# Patient Record
Sex: Female | Born: 1985
Health system: Southern US, Community
[De-identification: ages and names within clinical notes are randomized; demographics above are authoritative.]

## PROBLEM LIST (undated history)

## (undated) DIAGNOSIS — E282 Polycystic ovarian syndrome: Secondary | ICD-10-CM

## (undated) DIAGNOSIS — J45909 Unspecified asthma, uncomplicated: Secondary | ICD-10-CM

## (undated) HISTORY — DX: Polycystic ovarian syndrome: E28.2

---

## 2010-06-29 ENCOUNTER — Emergency Department (HOSPITAL_COMMUNITY)
Admission: EM | Admit: 2010-06-29 | Discharge: 2010-06-29 | Payer: Self-pay | Source: Home / Self Care | Admitting: Family Medicine

## 2010-06-29 LAB — POCT PREGNANCY, URINE: Preg Test, Ur: NEGATIVE

## 2010-06-29 LAB — POCT URINALYSIS DIPSTICK
Bilirubin Urine: NEGATIVE
Ketones, ur: NEGATIVE mg/dL
Protein, ur: 30 mg/dL — AB
Urine Glucose, Fasting: NEGATIVE mg/dL

## 2012-08-25 ENCOUNTER — Emergency Department (HOSPITAL_COMMUNITY)
Admission: EM | Admit: 2012-08-25 | Discharge: 2012-08-26 | Disposition: A | Discharge status: Home / Self Care | Payer: BC Managed Care – PPO | Attending: Emergency Medicine | Admitting: Emergency Medicine

## 2012-08-25 ENCOUNTER — Encounter (HOSPITAL_COMMUNITY): Payer: Self-pay | Admitting: *Deleted

## 2012-08-25 ENCOUNTER — Emergency Department (HOSPITAL_COMMUNITY): Payer: BC Managed Care – PPO

## 2012-08-25 DIAGNOSIS — Z7982 Long term (current) use of aspirin: Secondary | ICD-10-CM | POA: Insufficient documentation

## 2012-08-25 DIAGNOSIS — Y92838 Other recreation area as the place of occurrence of the external cause: Secondary | ICD-10-CM | POA: Insufficient documentation

## 2012-08-25 DIAGNOSIS — Y9351 Activity, roller skating (inline) and skateboarding: Secondary | ICD-10-CM | POA: Insufficient documentation

## 2012-08-25 DIAGNOSIS — R229 Localized swelling, mass and lump, unspecified: Secondary | ICD-10-CM | POA: Insufficient documentation

## 2012-08-25 DIAGNOSIS — Y9239 Other specified sports and athletic area as the place of occurrence of the external cause: Secondary | ICD-10-CM | POA: Insufficient documentation

## 2012-08-25 DIAGNOSIS — S62329A Displaced fracture of shaft of unspecified metacarpal bone, initial encounter for closed fracture: Secondary | ICD-10-CM | POA: Insufficient documentation

## 2012-08-25 DIAGNOSIS — S62309A Unspecified fracture of unspecified metacarpal bone, initial encounter for closed fracture: Secondary | ICD-10-CM

## 2012-08-25 DIAGNOSIS — J45909 Unspecified asthma, uncomplicated: Secondary | ICD-10-CM | POA: Insufficient documentation

## 2012-08-25 HISTORY — DX: Unspecified asthma, uncomplicated: J45.909

## 2012-08-25 NOTE — ED Notes (Signed)
The pt fell roller skating earlier tonight and fell onto her  Rt hand

## 2012-08-26 MED ORDER — OXYCODONE-ACETAMINOPHEN 5-325 MG PO TABS: 1.0000 | ORAL_TABLET | ORAL | Status: DC | PRN

## 2012-08-26 NOTE — ED Provider Notes (Signed)
History     CSN: [id number]  Arrival date & time 08/25/12  2251   First MD Initiated Contact with Patient 08/25/12 2351      Chief Complaint  Patient presents with  . Finger Injury    (Consider location/radiation/quality/duration/timing/severity/associated sxs/prior treatment) HPI Comments: Patient is a 27 year old female who presents with right hand pain and swelling after falling at a rollerskating rink earlier tonight.  Patient states that she slipped and fell hitting her right hand on one of the nearby benches in attempt to catch herself.  Patient admits to associated swelling and pain on the dorsal surface of her hand.  Patient states movement aggravates the pain, primarily with wrist extension.  The pain is alleviated with rest and nonmovement.  Patient denies fever, head injury, LOC, numbness or tingling in her extremities, or right hand weakness. Denies injury or pain to any other area of her body.  The history is provided by the patient. No language interpreter was used.    Past Medical History  Diagnosis Date  . Asthma     History reviewed. No pertinent past surgical history.  No family history on file.  History  Substance Use Topics  . Smoking status: Never Smoker   . Smokeless tobacco: Not on file  . Alcohol Use: No    OB History   Grav Para Term Preterm Abortions TAB SAB Ect Mult Living                  Review of Systems  Constitutional: Negative for fever and chills.  Musculoskeletal: Positive for joint swelling and arthralgias.  Skin: Negative for color change, pallor and rash.  Neurological: Negative for weakness and numbness.  All other systems reviewed and are negative.    Allergies  Review of patient's allergies indicates no known allergies.  Home Medications   Current Outpatient Rx  Name  Route  Sig  Dispense  Refill  . norethindrone (MICRONOR,CAMILA,ERRIN) 0.35 MG tablet   Oral   Take 1 tablet by mouth daily.         Marland Kitchen  oxyCODONE-acetaminophen (PERCOCET/ROXICET) 5-325 MG per tablet   Oral   Take 1 tablet by mouth every 4 (four) hours as needed for pain.   6 tablet   0     BP 156/99  Pulse 88  Temp(Src) 98.7 F (37.1 C) (Oral)  Resp 18  SpO2 98%  LMP 08/02/2012  Physical Exam  Nursing note and vitals reviewed. Constitutional: She is oriented to person, place, and time. She appears well-developed and well-nourished.  HENT:  Head: Normocephalic and atraumatic.  Eyes: Conjunctivae are normal. Pupils are equal, round, and reactive to light. No scleral icterus.  Neck: Normal range of motion.  Cardiovascular: Intact distal pulses.   Capillary refill less than 2 seconds in bilateral upper extremities.  Distal radial pulses 2+ bilaterally.  Pulmonary/Chest: Effort normal. No respiratory distress.  Musculoskeletal: She exhibits tenderness. She exhibits no edema.       Right hand: She exhibits tenderness and bony tenderness. She exhibits normal two-point discrimination, normal capillary refill and no deformity. Normal sensation noted. Normal strength noted.  Patient has tenderness on palpation of the fourth right MCP joint.  She exhibits near full range of motion, with some limited wrist extension secondary to discomfort.  Soft tissue swelling mild, diffuse and appreciated on the dorsal surface of the patient's right hand.  No erythema, abrasions, ecchymosis, or lacerations appreciated.  Patient is able to make a fist and has 5  out of 5 strength against resistance of her FDP, FDS, and extensors of her R hand. Sensation intact.  Neurological: She is alert and oriented to person, place, and time.  Skin: Skin is warm and dry. She is not diaphoretic.  Psychiatric: She has a normal mood and affect. Her behavior is normal.    ED Course  Procedures (including critical care time)  Labs Reviewed - No data to display Dg Hand Complete Right  08/25/2012  *RADIOLOGY REPORT*  Clinical Data: Fall.  Metacarpal pain.   RIGHT HAND - COMPLETE 3+ VIEW  Comparison: None.  Findings: There is an oblique fracture of the right fourth metacarpal in the mid shaft, with mild dorsal and ulnar displacement.  Soft tissue swelling is evident on the lateral view. Otherwise the hand appears normal.  The other metacarpals are intact.  IMPRESSION: Oblique mildly displaced fracture of the fourth metacarpal mid shaft.   Original Report Authenticated By: Andreas Newport, M.D.      1. Metacarpal bone fracture, closed, initial encounter       MDM  Mildly displaced fracture of the 4th MCP mid shaft. Short arm splint for wrist and hand immobilization applied in ED. Patient neurovascularly intact without findings consistent with compartment syndrome, well and nontoxic appearing with stable VS. Patient to d/c with Orthopedic follow up regarding her MCP fracture. Patient given percocet to take as needed for pain and discomfort. Indications for ED return discussed. Patient states comfort and understanding with this d/c plan with no unaddressed concerns.        Antony Madura, PA-C 08/27/12 1303

## 2012-08-27 NOTE — ED Provider Notes (Signed)
Medical screening examination/treatment/procedure(s) were performed by non-physician practitioner and as supervising physician I was immediately available for consultation/collaboration.  John-Adam Lanisha Stepanian, M.D.     John-Adam Nason Conradt, MD 08/27/12 1326 

## 2012-11-17 ENCOUNTER — Encounter (HOSPITAL_COMMUNITY): Payer: Self-pay | Admitting: *Deleted

## 2012-11-17 ENCOUNTER — Emergency Department (HOSPITAL_COMMUNITY)
Admission: EM | Admit: 2012-11-17 | Discharge: 2012-11-17 | Disposition: A | Discharge status: Home / Self Care | Payer: BC Managed Care – PPO | Source: Home / Self Care | Attending: Emergency Medicine | Admitting: Emergency Medicine

## 2012-11-17 DIAGNOSIS — J020 Streptococcal pharyngitis: Secondary | ICD-10-CM

## 2012-11-17 LAB — POCT RAPID STREP A: Streptococcus, Group A Screen (Direct): POSITIVE — AB

## 2012-11-17 MED ORDER — METHYLPREDNISOLONE 4 MG PO KIT: PACK | ORAL | Status: DC

## 2012-11-17 MED ORDER — AMOXICILLIN 875 MG PO TABS: 875.0000 mg | ORAL_TABLET | Freq: Two times a day (BID) | ORAL | Status: DC

## 2012-11-17 MED ORDER — DPH-LIDO-ALHYDR-MGHYDR-SIMETH MT SUSP: 5.0000 mL | OROMUCOSAL | Status: DC | PRN

## 2012-11-17 MED ORDER — NAPROXEN 500 MG PO TABS: 500.0000 mg | ORAL_TABLET | Freq: Two times a day (BID) | ORAL | Status: DC

## 2012-11-17 NOTE — ED Provider Notes (Signed)
History     CSN: [id number]  Arrival date & time 11/17/12  1527   First MD Initiated Contact with Patient 11/17/12 1604      Chief Complaint  Patient presents with  . Sore Throat    (Consider location/radiation/quality/duration/timing/severity/associated sxs/prior treatment) HPI Comments: 27 year old female presents complaining of a sore throat for 3 days along with a mild cough. The pain is made worse by swallowing. She denies any subjective fever, chills, palpitations, rash, or shortness of breath. The cough is mild, infrequent, and nonproductive. She has been taking over-the-counter combination cold medicine with mild relief of her symptoms.  Patient is a 27 y.o. female presenting with pharyngitis.  Sore Throat Pertinent negatives include no chest pain, no abdominal pain and no shortness of breath.    Past Medical History  Diagnosis Date  . Asthma     History reviewed. No pertinent past surgical history.  No family history on file.  History  Substance Use Topics  . Smoking status: Never Smoker   . Smokeless tobacco: Not on file  . Alcohol Use: No    OB History   Grav Para Term Preterm Abortions TAB SAB Ect Mult Living                  Review of Systems  Constitutional: Negative for fever and chills.  HENT: Positive for sore throat. Negative for ear pain, congestion, rhinorrhea, sneezing, drooling, trouble swallowing, neck pain, neck stiffness, voice change, sinus pressure and ear discharge.   Eyes: Negative for visual disturbance.  Respiratory: Negative for cough and shortness of breath.   Cardiovascular: Negative for chest pain, palpitations and leg swelling.  Gastrointestinal: Negative for nausea, vomiting and abdominal pain.  Endocrine: Negative for polydipsia and polyuria.  Genitourinary: Negative for dysuria, urgency and frequency.  Musculoskeletal: Negative for myalgias and arthralgias.  Skin: Negative for rash.  Neurological: Negative for dizziness,  weakness and light-headedness.    Allergies  Review of patient's allergies indicates no known allergies.  Home Medications   Current Outpatient Rx  Name  Route  Sig  Dispense  Refill  . amoxicillin (AMOXIL) 875 MG tablet   Oral   Take 1 tablet (875 mg total) by mouth 2 (two) times daily.   14 tablet   0   . DPH-Lido-AlHydr-MgHydr-Simeth SUSP   Mouth/Throat   Use as directed 5 mLs in the mouth or throat every hour as needed (gargle and spit).   200 mL   0   . methylPREDNISolone (MEDROL DOSEPAK) 4 MG tablet      Use taper dose pack as directed   21 tablet   0   . naproxen (NAPROSYN) 500 MG tablet   Oral   Take 1 tablet (500 mg total) by mouth 2 (two) times daily.   60 tablet   0   . norethindrone (MICRONOR,CAMILA,ERRIN) 0.35 MG tablet   Oral   Take 1 tablet by mouth daily.         Marland Kitchen oxyCODONE-acetaminophen (PERCOCET/ROXICET) 5-325 MG per tablet   Oral   Take 1 tablet by mouth every 4 (four) hours as needed for pain.   6 tablet   0     BP 144/99  Pulse 78  Temp(Src) 98.6 F (37 C) (Oral)  Resp 18  SpO2 100%  LMP 11/16/2012  Physical Exam  Nursing note and vitals reviewed. Constitutional: She is oriented to person, place, and time. Vital signs are normal. She appears well-developed and well-nourished. No distress.  HENT:  Head: Atraumatic.  Mouth/Throat: Oropharyngeal exudate and posterior oropharyngeal erythema present.  Eyes: EOM are normal. Pupils are equal, round, and reactive to light.  Cardiovascular: Normal rate, regular rhythm and normal heart sounds.  Exam reveals no gallop and no friction rub.   No murmur heard. Pulmonary/Chest: Effort normal and breath sounds normal. No respiratory distress. She has no wheezes. She has no rales.  Abdominal: Soft. There is no tenderness.  Neurological: She is alert and oriented to person, place, and time. She has normal strength.  Skin: Skin is warm and dry. She is not diaphoretic.  Psychiatric: She has a  normal mood and affect. Her behavior is normal. Judgment normal.    ED Course  Procedures (including critical care time)  Labs Reviewed  POCT RAPID STREP A ([facility] ONLY) - Abnormal; Notable for the following:    Streptococcus, Group A Screen (Direct) POSITIVE (*)    All other components within normal limits   No results found.   1. Strep pharyngitis       MDM  The rapid strep is positive, we'll treat symptomatically and also with amoxicillin. She will followup if she does not improve.   Meds ordered this encounter  Medications  . DPH-Lido-AlHydr-MgHydr-Simeth SUSP    Sig: Use as directed 5 mLs in the mouth or throat every hour as needed (gargle and spit).    Dispense:  200 mL    Refill:  0  . naproxen (NAPROSYN) 500 MG tablet    Sig: Take 1 tablet (500 mg total) by mouth 2 (two) times daily.    Dispense:  60 tablet    Refill:  0  . methylPREDNISolone (MEDROL DOSEPAK) 4 MG tablet    Sig: Use taper dose pack as directed    Dispense:  21 tablet    Refill:  0  . amoxicillin (AMOXIL) 875 MG tablet    Sig: Take 1 tablet (875 mg total) by mouth 2 (two) times daily.    Dispense:  14 tablet    Refill:  0   ]        Graylon Good, PA-C 11/17/12 1726

## 2012-11-17 NOTE — ED Provider Notes (Signed)
Medical screening examination/treatment/procedure(s) were performed by non-physician practitioner and as supervising physician I was immediately available for consultation/collaboration.  Leslee Home, M.D.  Reuben Likes, MD 11/17/12 Rickey Primus

## 2012-11-17 NOTE — ED Notes (Signed)
Pt  Reports  Symptoms  Of  sorethroat   X  3  Days   She  Reports  Pain is  Worse  When she  Swallows     She  Is  Sitting  Upright on  Exam   Table  She is  Speaking in  Complete  sentances

## 2013-11-13 IMAGING — CR DG HAND COMPLETE 3+V*R*
3 series · 3 of 3 positions shown · non-contrast
Comparison: None.

CLINICAL DATA: Fall.  Metacarpal pain.

RIGHT HAND - COMPLETE 3+ VIEW

[x hand pa right]
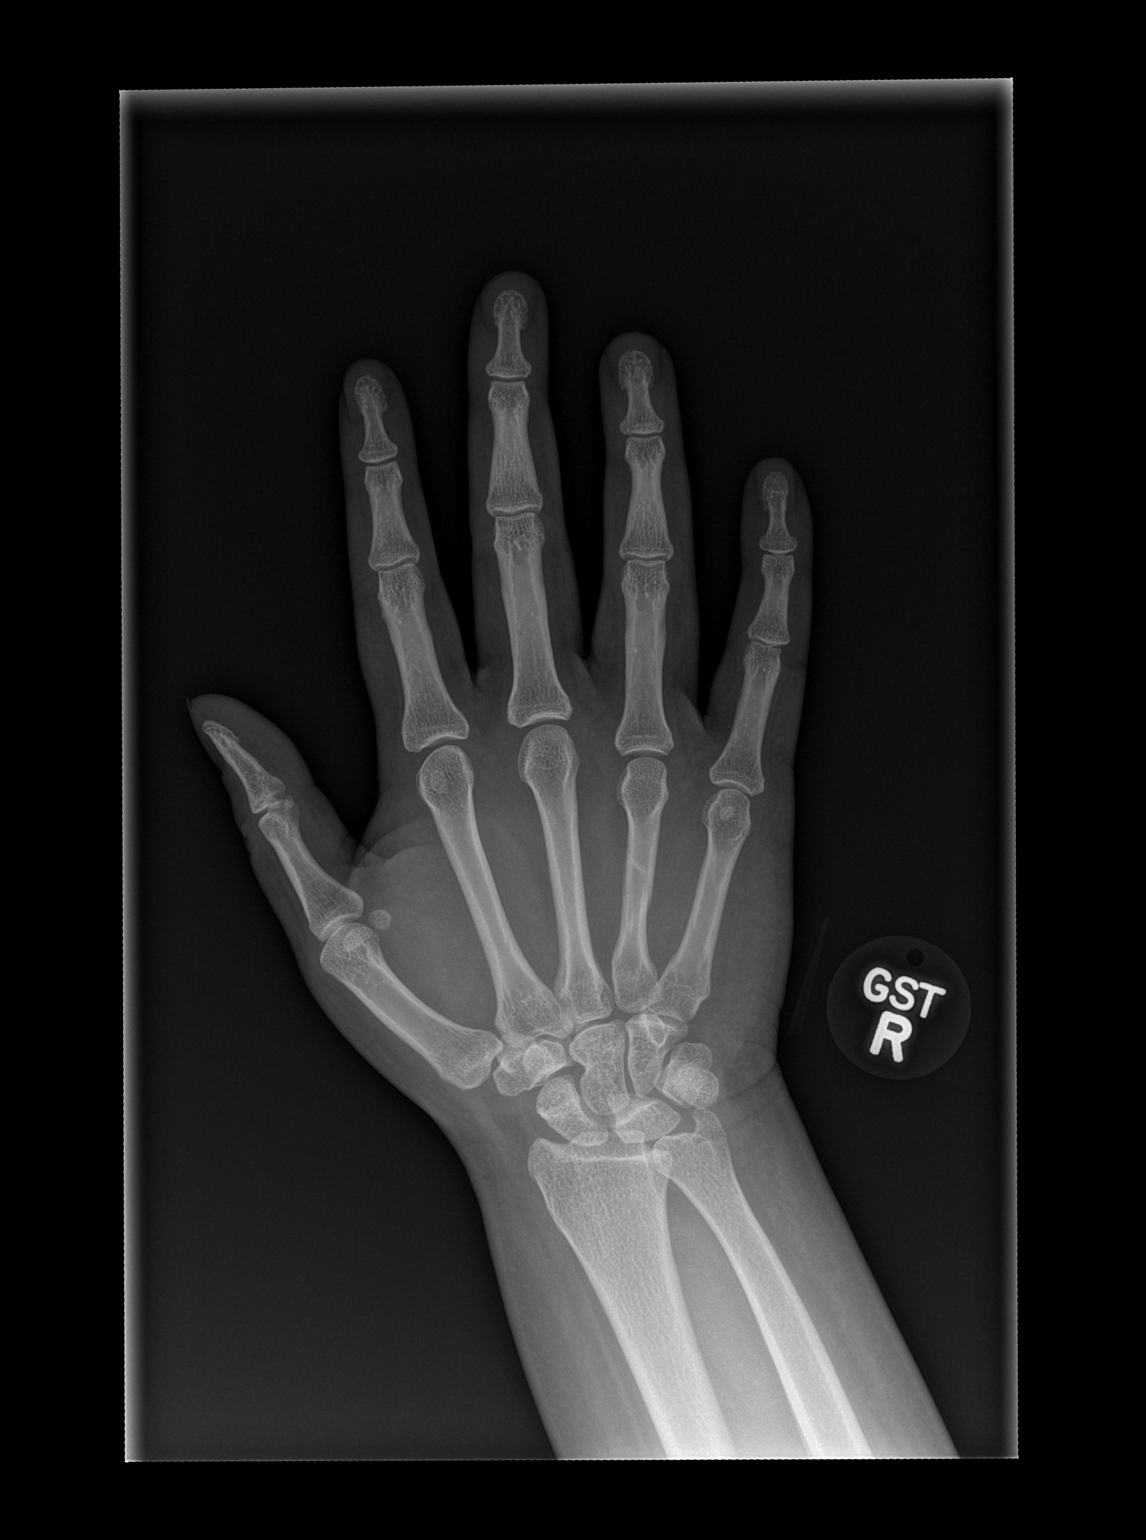

[x hand obl right]
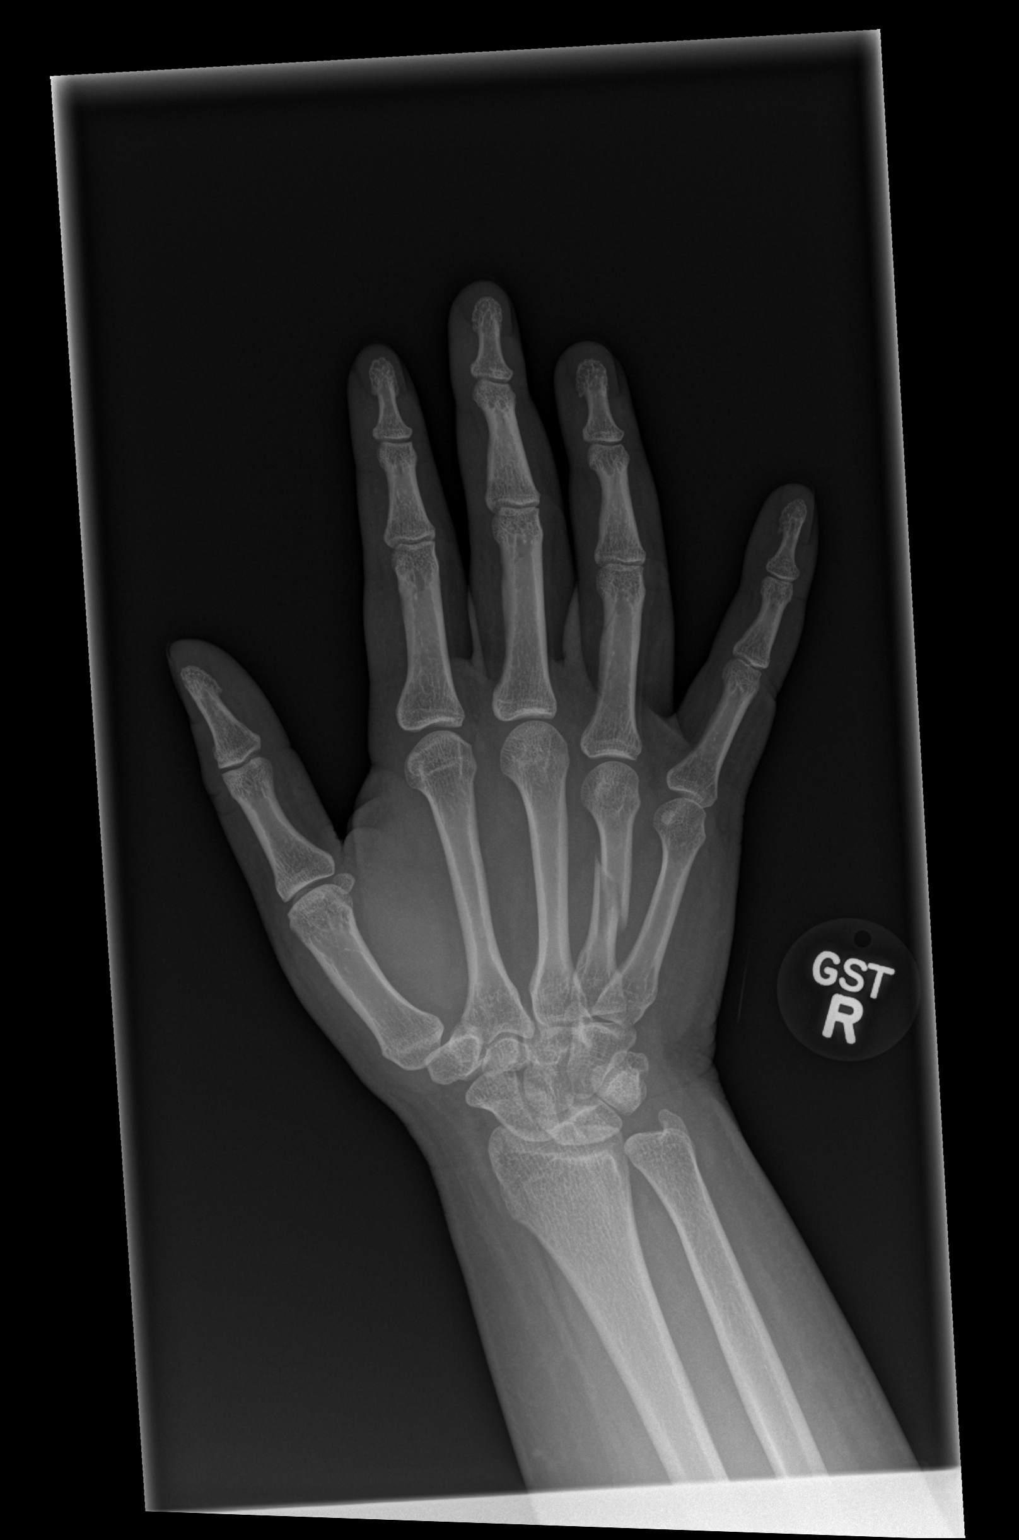

[x hand lat right]
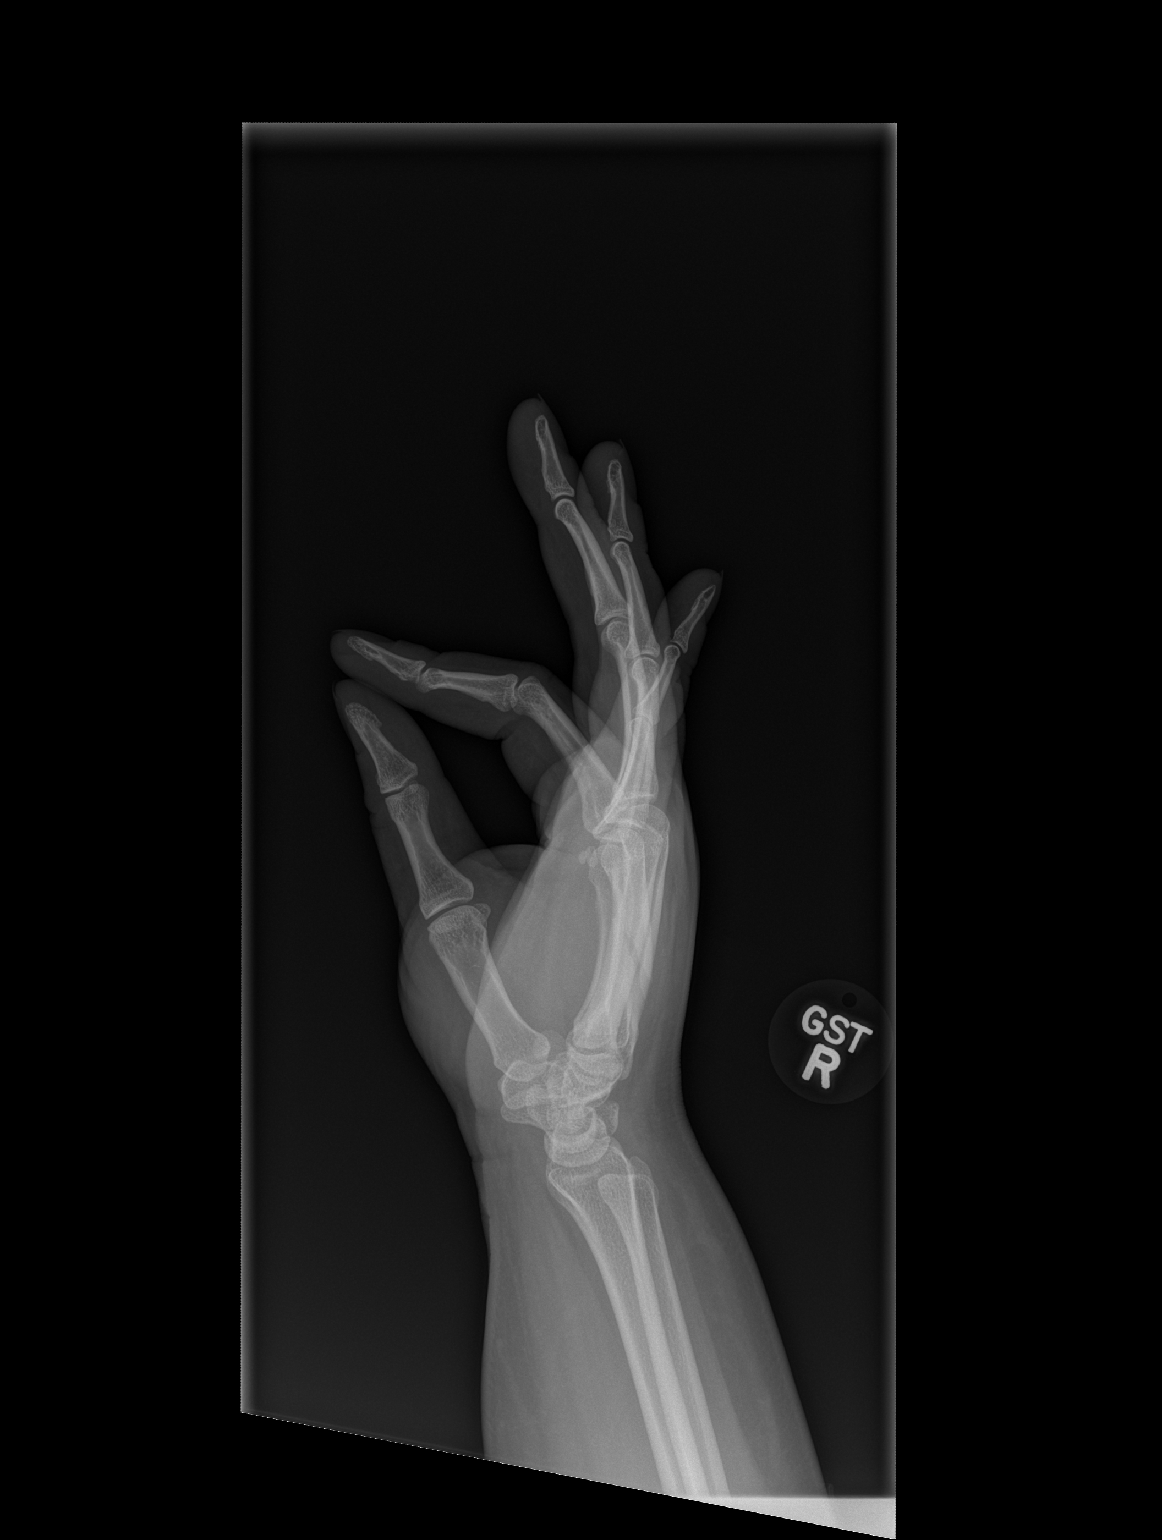

[3 of 3 positions shown; findings below may reference images not displayed]

FINDINGS: There is an oblique fracture of the right fourth
metacarpal in the mid shaft, with mild dorsal and ulnar
displacement.  Soft tissue swelling is evident on the lateral view.
Otherwise the hand appears normal.  The other metacarpals are
intact.
IMPRESSION: Oblique mildly displaced fracture of the fourth metacarpal mid
shaft.

## 2013-12-31 ENCOUNTER — Emergency Department: Payer: Self-pay | Admitting: Emergency Medicine

## 2015-01-06 ENCOUNTER — Emergency Department (HOSPITAL_COMMUNITY)
Admission: EM | Admit: 2015-01-06 | Discharge: 2015-01-06 | Disposition: A | Discharge status: Home / Self Care | Payer: BLUE CROSS/BLUE SHIELD | Attending: Emergency Medicine | Admitting: Emergency Medicine

## 2015-01-06 ENCOUNTER — Encounter (HOSPITAL_COMMUNITY): Payer: Self-pay | Admitting: *Deleted

## 2015-01-06 DIAGNOSIS — Z792 Long term (current) use of antibiotics: Secondary | ICD-10-CM | POA: Diagnosis not present

## 2015-01-06 DIAGNOSIS — R030 Elevated blood-pressure reading, without diagnosis of hypertension: Secondary | ICD-10-CM | POA: Diagnosis not present

## 2015-01-06 DIAGNOSIS — Z793 Long term (current) use of hormonal contraceptives: Secondary | ICD-10-CM | POA: Insufficient documentation

## 2015-01-06 DIAGNOSIS — H6121 Impacted cerumen, right ear: Secondary | ICD-10-CM | POA: Diagnosis present

## 2015-01-06 DIAGNOSIS — Z791 Long term (current) use of non-steroidal anti-inflammatories (NSAID): Secondary | ICD-10-CM | POA: Diagnosis not present

## 2015-01-06 DIAGNOSIS — J45909 Unspecified asthma, uncomplicated: Secondary | ICD-10-CM | POA: Diagnosis not present

## 2015-01-06 DIAGNOSIS — IMO0001 Reserved for inherently not codable concepts without codable children: Secondary | ICD-10-CM

## 2015-01-06 NOTE — Discharge Instructions (Signed)
Cerumen Impaction °A cerumen impaction is when the wax in your ear forms a plug. This plug usually causes reduced hearing. Sometimes it also causes an earache or dizziness. Removing a cerumen impaction can be difficult and painful. The wax sticks to the ear canal. The canal is sensitive and bleeds easily. If you try to remove a heavy wax buildup with a cotton tipped swab, you may push it in further. °Irrigation with water, suction, and small ear curettes may be used to clear out the wax. If the impaction is fixed to the skin in the ear canal, ear drops may be needed for a few days to loosen the wax. People who build up a lot of wax frequently can use ear wax removal products available in your local drugstore. °SEEK MEDICAL CARE IF:  °You develop an earache, increased hearing loss, or marked dizziness. °Document Released: 06/29/2004 Document Revised: 08/14/2011 Document Reviewed: 08/19/2009 °[employer]® Patient Information ©2015 [employer]. This information is not intended to replace advice given to you by your health care provider. Make sure you discuss any questions you have with your health care provider. ° °

## 2015-01-06 NOTE — ED Notes (Addendum)
Pt complains of her ear being clogged since Friday. [facility] and was given debrox to put in her ear for wax buildup. Pt states nothing has come out of her ear since then and her ear pain still feels clogged. Pt denies pain in her ear at this time.

## 2015-01-06 NOTE — ED Provider Notes (Signed)
CSN: [id number]     Arrival date & time 01/06/15  1349 History  This chart was scribed for non-physician practitioner Santiago Glad, PA-C working with Rolland Porter, MD by Littie Deeds, ED Scribe. This patient was seen in room WTR9/WTR9 and the patient's care was started at 3:23 PM.       Chief Complaint  Patient presents with  . Cerumen Impaction   The history is provided by the patient. No language interpreter was used.   HPI Comments: Brittany Mcintyre is a 29 y.o. female who presents to the Emergency Department complaining of cerumen impaction, worse in the right ear. She does report having some muffled hearing on the right side. [facility] and was given debrox for earwax buildup, but she has not felt any improvement or any wax coming out. Patient denies ear pain, ear discharge, and fever.  Past Medical History  Diagnosis Date  . Asthma    History reviewed. No pertinent past surgical history. No family history on file. History  Substance Use Topics  . Smoking status: Never Smoker   . Smokeless tobacco: Not on file  . Alcohol Use: No   OB History    No data available     Review of Systems  Constitutional: Negative for fever.  HENT: Positive for hearing loss. Negative for ear discharge and ear pain.       Allergies  Review of patient's allergies indicates no known allergies.  Home Medications   Prior to Admission medications   Medication Sig Start Date End Date Taking? Authorizing Provider  amoxicillin (AMOXIL) 875 MG tablet Take 1 tablet (875 mg total) by mouth 2 (two) times daily. 11/17/12   Graylon Good, PA-C  DPH-Lido-AlHydr-MgHydr-Simeth SUSP Use as directed 5 mLs in the mouth or throat every hour as needed (gargle and spit). 11/17/12   Graylon Good, PA-C  methylPREDNISolone (MEDROL DOSEPAK) 4 MG tablet Use taper dose pack as directed 11/17/12   Graylon Good, PA-C  naproxen (NAPROSYN) 500 MG tablet Take 1 tablet (500 mg total) by  mouth 2 (two) times daily. 11/17/12   Adrian Blackwater Baker, PA-C  norethindrone (MICRONOR,CAMILA,ERRIN) 0.35 MG tablet Take 1 tablet by mouth daily.    Historical Provider, MD  oxyCODONE-acetaminophen (PERCOCET/ROXICET) 5-325 MG per tablet Take 1 tablet by mouth every 4 (four) hours as needed for pain. 08/26/12   Antony Madura, PA-C   BP 155/115 mmHg  Pulse 84  Temp(Src) 97.7 F (36.5 C) (Oral)  Resp 18  SpO2 94%  LMP 12/30/2014 Physical Exam  Constitutional: She is oriented to person, place, and time. She appears well-developed and well-nourished. No distress.  HENT:  Head: Normocephalic and atraumatic.  Left Ear: Tympanic membrane and ear canal normal.  Mouth/Throat: Oropharynx is clear and moist. No oropharyngeal exudate.  Cerumen impaction on the right.  Eyes: Pupils are equal, round, and reactive to light.  Neck: Neck supple.  Cardiovascular: Normal rate, regular rhythm and normal heart sounds.   No murmur heard. Pulmonary/Chest: Effort normal and breath sounds normal. No respiratory distress. She has no wheezes. She has no rales.  Musculoskeletal: She exhibits no edema.  Neurological: She is alert and oriented to person, place, and time. No cranial nerve deficit.  Skin: Skin is warm and dry. No rash noted.  Psychiatric: She has a normal mood and affect. Her behavior is normal.  Nursing note and vitals reviewed.   ED Course  Procedures  DIAGNOSTIC STUDIES: Oxygen  Saturation is 94% on room air, adequate by my interpretation.    COORDINATION OF CARE: 3:27 PM-Discussed treatment plan which includes ear wax removal with patient/guardian at bedside and patient/guardian agreed to plan.    Labs Review Labs Reviewed - No data to display  Imaging Review No results found.   EKG Interpretation None      MDM   Final diagnoses:  None  Patient presents today with cerumen impaction of the right ear.  No signs of AOE.  Unable to fully visualize the TM.  However, she denies ear pain.   Ear irrigated in the ED.  Feel that the patient is stable for discharge.  Return precautions given.  I personally performed the services described in this documentation, which was scribed in my presence. The recorded information has been reviewed and is accurate.    Santiago Glad, PA-C 01/06/15 1748  Doug Sou, MD 01/08/15 0700

## 2019-04-09 DIAGNOSIS — F4323 Adjustment disorder with mixed anxiety and depressed mood: Secondary | ICD-10-CM | POA: Diagnosis not present

## 2019-04-28 DIAGNOSIS — Z20828 Contact with and (suspected) exposure to other viral communicable diseases: Secondary | ICD-10-CM | POA: Diagnosis not present

## 2019-05-14 DIAGNOSIS — F4323 Adjustment disorder with mixed anxiety and depressed mood: Secondary | ICD-10-CM | POA: Diagnosis not present

## 2020-08-18 ENCOUNTER — Other Ambulatory Visit: Payer: Self-pay | Admitting: Obstetrics and Gynecology

## 2020-08-18 DIAGNOSIS — E049 Nontoxic goiter, unspecified: Secondary | ICD-10-CM

## 2020-09-02 ENCOUNTER — Ambulatory Visit
Admission: RE | Admit: 2020-09-02 | Discharge: 2020-09-02 | Disposition: A | Discharge status: Home / Self Care | Payer: BC Managed Care – PPO | Source: Ambulatory Visit | Attending: Obstetrics and Gynecology | Admitting: Obstetrics and Gynecology

## 2020-09-02 DIAGNOSIS — E049 Nontoxic goiter, unspecified: Secondary | ICD-10-CM

## 2020-10-22 ENCOUNTER — Other Ambulatory Visit: Payer: Self-pay | Admitting: Endocrinology

## 2020-10-22 ENCOUNTER — Other Ambulatory Visit: Payer: Self-pay | Admitting: Obstetrics and Gynecology

## 2020-10-22 DIAGNOSIS — E049 Nontoxic goiter, unspecified: Secondary | ICD-10-CM

## 2020-11-02 ENCOUNTER — Ambulatory Visit: Payer: BC Managed Care – PPO | Admitting: Cardiovascular Disease

## 2020-11-04 ENCOUNTER — Ambulatory Visit
Admission: RE | Admit: 2020-11-04 | Discharge: 2020-11-04 | Disposition: A | Discharge status: Home / Self Care | Payer: BC Managed Care – PPO | Source: Ambulatory Visit | Attending: Endocrinology | Admitting: Endocrinology

## 2020-11-04 ENCOUNTER — Other Ambulatory Visit (HOSPITAL_COMMUNITY)
Admission: RE | Admit: 2020-11-04 | Discharge: 2020-11-04 | Disposition: A | Discharge status: Home / Self Care | Payer: BC Managed Care – PPO | Source: Ambulatory Visit | Attending: Radiology | Admitting: Radiology

## 2020-11-04 DIAGNOSIS — E049 Nontoxic goiter, unspecified: Secondary | ICD-10-CM | POA: Diagnosis not present

## 2020-11-05 LAB — CYTOLOGY - NON PAP

## 2021-01-20 DIAGNOSIS — Z23 Encounter for immunization: Secondary | ICD-10-CM | POA: Diagnosis not present

## 2021-02-08 ENCOUNTER — Ambulatory Visit (INDEPENDENT_AMBULATORY_CARE_PROVIDER_SITE_OTHER): Payer: BC Managed Care – PPO | Admitting: Cardiovascular Disease

## 2021-02-08 ENCOUNTER — Encounter (HOSPITAL_BASED_OUTPATIENT_CLINIC_OR_DEPARTMENT_OTHER): Payer: Self-pay | Admitting: Cardiovascular Disease

## 2021-02-08 ENCOUNTER — Encounter: Payer: Self-pay | Admitting: *Deleted

## 2021-02-08 ENCOUNTER — Other Ambulatory Visit: Payer: Self-pay

## 2021-02-08 DIAGNOSIS — I10 Essential (primary) hypertension: Secondary | ICD-10-CM

## 2021-02-08 DIAGNOSIS — Z006 Encounter for examination for normal comparison and control in clinical research program: Secondary | ICD-10-CM

## 2021-02-08 HISTORY — DX: Morbid (severe) obesity due to excess calories: E66.01

## 2021-02-08 HISTORY — DX: Essential (primary) hypertension: I10

## 2021-02-08 MED ORDER — VALSARTAN-HYDROCHLOROTHIAZIDE 320-25 MG PO TABS: 1.0000 | ORAL_TABLET | Freq: Every day | ORAL | 1 refills | Status: DC

## 2021-02-08 NOTE — Assessment & Plan Note (Addendum)
Her blood pressure is poorly controlled.  Her diastolic is not at goal.  She has been taking losartan/HCTZ.  She is already working on compliance.  She has a system that works for her to be able to remember her medicines.  We will enroll her in the PREP program to the [facility] to increase her exercise to at least 150 minutes weekly.  She will also work on her diet and received an advance hypertension clinic booklet.  She is interested in our remote patient monitoring study and consents to monitoring in the [employer] program.  She never had a work-up for early onset of hypertension.  We will check renal artery Dopplers.  Given that her blood pressure is nearly controlled on 2 agents, no plan to check renin and aldosterone levels.  She had a TSH.  We will get a copy from her endocrinologist.  She does have symptoms concerning for sleep apnea including snoring and daytime somnolence.  We will get a sleep study.  She noted an interest in getting pregnant.  If she finds a partner she will let us know and we will stop her ARB.  For now, we will switch losartan to valsartan 320mg  and continue HCTZ.

## 2021-02-08 NOTE — Patient Instructions (Addendum)
Medication Instructions:  WHEN YOU FINISH YOUR CURRENT LOSARTAN HCT RX START VALSARTAN HCT 320-25 MG DAILY    Labwork: WILL REQUEST FROM DR Talmage Nap    Testing/Procedures: Your physician has requested that you have a renal artery duplex. During this test, an ultrasound is used to evaluate blood flow to the kidneys. Allow one hour for this exam. Do not eat after midnight the day before and avoid carbonated beverages. Take your medications as you usually do.   Your physician has recommended that you have a sleep study. This test records several body functions during sleep, including: brain activity, eye movement, oxygen and carbon dioxide blood levels, heart rate and rhythm, breathing rate and rhythm, the flow of air through your mouth and nose, snoring, body muscle movements, and chest and belly movement. ONCE INSURANCE HAS APPROVED THE OFFICE WILL CALL YOU TO SCHEDULE. IF YOU DO NOT HEAR IN 2 WEEKS CALL THE OFFICE TO FOLLOW UP    Follow-Up: 03/14/2021 10:30 AM WITH PHARM D AT [facility]    You will receive a phone call from the PREP exercise and nutrition program to schedule an initial assessment.   Special Instructions:   MONITOR YOUR BLOOD PRESSURE TWICE A DAY, LOG IN THE BOOK PROVIDED. BRING THE BOOK AND YOUR BLOOD PRESSURE MACHINE TO YOUR FOLLOW UP IN 1 MONTH   DASH Eating Plan DASH stands for "Dietary Approaches to Stop Hypertension." The DASH eating plan is a healthy eating plan that has been shown to reduce high blood pressure (hypertension). It may also reduce your risk for type 2 diabetes, heart disease, and stroke. The DASH eating plan may also help with weight loss. What are tips for following this plan?  General guidelines Avoid eating more than 2,300 mg (milligrams) of salt (sodium) a day. If you have hypertension, you may need to reduce your sodium intake to 1,500 mg a day. Limit alcohol intake to no more than 1 drink a day for nonpregnant women and 2 drinks a day  for men. One drink equals 12 oz of beer, 5 oz of wine, or 1 oz of hard liquor. Work with your health care provider to maintain a healthy body weight or to lose weight. Ask what an ideal weight is for you. Get at least 30 minutes of exercise that causes your heart to beat faster (aerobic exercise) most days of the week. Activities may include walking, swimming, or biking. Work with your health care provider or diet and nutrition specialist (dietitian) to adjust your eating plan to your individual calorie needs. Reading food labels  Check food labels for the amount of sodium per serving. Choose foods with less than 5 percent of the Daily Value of sodium. Generally, foods with less than 300 mg of sodium per serving fit into this eating plan. To find whole grains, look for the word "whole" as the first word in the ingredient list. Shopping Buy products labeled as "low-sodium" or "no salt added." Buy fresh foods. Avoid canned foods and premade or frozen meals. Cooking Avoid adding salt when cooking. Use salt-free seasonings or herbs instead of table salt or sea salt. Check with your health care provider or pharmacist before using salt substitutes. Do not fry foods. Cook foods using healthy methods such as baking, boiling, grilling, and broiling instead. Cook with heart-healthy oils, such as olive, canola, soybean, or sunflower oil. Meal planning Eat a balanced diet that includes: 5 or more servings of fruits and vegetables each day. At each meal, try  to fill half of your plate with fruits and vegetables. Up to 6-8 servings of whole grains each day. Less than 6 oz of lean meat, poultry, or fish each day. A 3-oz serving of meat is about the same size as a deck of cards. One egg equals 1 oz. 2 servings of low-fat dairy each day. A serving of nuts, seeds, or beans 5 times each week. Heart-healthy fats. Healthy fats called Omega-3 fatty acids are found in foods such as flaxseeds and coldwater fish, like  sardines, salmon, and mackerel. Limit how much you eat of the following: Canned or prepackaged foods. Food that is high in trans fat, such as fried foods. Food that is high in saturated fat, such as fatty meat. Sweets, desserts, sugary drinks, and other foods with added sugar. Full-fat dairy products. Do not salt foods before eating. Try to eat at least 2 vegetarian meals each week. Eat more home-cooked food and less restaurant, buffet, and fast food. When eating at a restaurant, ask that your food be prepared with less salt or no salt, if possible. What foods are recommended? The items listed may not be a complete list. Talk with your dietitian about what dietary choices are best for you. Grains Whole-grain or whole-wheat bread. Whole-grain or whole-wheat pasta. Brown rice. Orpah Cobb. Bulgur. Whole-grain and low-sodium cereals. Pita bread. Low-fat, low-sodium crackers. Whole-wheat flour tortillas. Vegetables Fresh or frozen vegetables (raw, steamed, roasted, or grilled). Low-sodium or reduced-sodium tomato and vegetable juice. Low-sodium or reduced-sodium tomato sauce and tomato paste. Low-sodium or reduced-sodium canned vegetables. Fruits All fresh, dried, or frozen fruit. Canned fruit in natural juice (without added sugar). Meat and other protein foods Skinless chicken or [place]. Ground chicken or [place]. Pork with fat trimmed off. Fish and seafood. Egg whites. Dried beans, peas, or lentils. Unsalted nuts, nut butters, and seeds. Unsalted canned beans. Lean cuts of beef with fat trimmed off. Low-sodium, lean deli meat. Dairy Low-fat (1%) or fat-free (skim) milk. Fat-free, low-fat, or reduced-fat cheeses. Nonfat, low-sodium ricotta or cottage cheese. Low-fat or nonfat yogurt. Low-fat, low-sodium cheese. Fats and oils Soft margarine without trans fats. Vegetable oil. Low-fat, reduced-fat, or light mayonnaise and salad dressings (reduced-sodium). Canola, safflower, olive, soybean, and  sunflower oils. Avocado. Seasoning and other foods Herbs. Spices. Seasoning mixes without salt. Unsalted popcorn and pretzels. Fat-free sweets. What foods are not recommended? The items listed may not be a complete list. Talk with your dietitian about what dietary choices are best for you. Grains Baked goods made with fat, such as croissants, muffins, or some breads. Dry pasta or rice meal packs. Vegetables Creamed or fried vegetables. Vegetables in a cheese sauce. Regular canned vegetables (not low-sodium or reduced-sodium). Regular canned tomato sauce and paste (not low-sodium or reduced-sodium). Regular tomato and vegetable juice (not low-sodium or reduced-sodium). Rosita Fire. Olives. Fruits Canned fruit in a light or heavy syrup. Fried fruit. Fruit in cream or butter sauce. Meat and other protein foods Fatty cuts of meat. Ribs. Fried meat. Tomasa Blase. Sausage. Bologna and other processed lunch meats. Salami. Fatback. Hotdogs. Bratwurst. Salted nuts and seeds. Canned beans with added salt. Canned or smoked fish. Whole eggs or egg yolks. Chicken or [place] with skin. Dairy Whole or 2% milk, cream, and half-and-half. Whole or full-fat cream cheese. Whole-fat or sweetened yogurt. Full-fat cheese. Nondairy creamers. Whipped toppings. Processed cheese and cheese spreads. Fats and oils Butter. Stick margarine. Lard. Shortening. Ghee. Bacon fat. Tropical oils, such as coconut, palm kernel, or palm oil. Seasoning and other foods  Salted popcorn and pretzels. Onion salt, garlic salt, seasoned salt, table salt, and sea salt. Worcestershire sauce. Tartar sauce. Barbecue sauce. Teriyaki sauce. Soy sauce, including reduced-sodium. Steak sauce. Canned and packaged gravies. Fish sauce. Oyster sauce. Cocktail sauce. Horseradish that you find on the shelf. Ketchup. Mustard. Meat flavorings and tenderizers. Bouillon cubes. Hot sauce and Tabasco sauce. Premade or packaged marinades. Premade or packaged taco seasonings.  Relishes. Regular salad dressings. Where to find more information: National Heart, Lung, and Blood Institute: PopSteam.is American Heart Association: www.heart.org Summary The DASH eating plan is a healthy eating plan that has been shown to reduce high blood pressure (hypertension). It may also reduce your risk for type 2 diabetes, heart disease, and stroke. With the DASH eating plan, you should limit salt (sodium) intake to 2,300 mg a day. If you have hypertension, you may need to reduce your sodium intake to 1,500 mg a day. When on the DASH eating plan, aim to eat more fresh fruits and vegetables, whole grains, lean proteins, low-fat dairy, and heart-healthy fats. Work with your health care provider or diet and nutrition specialist (dietitian) to adjust your eating plan to your individual calorie needs. This information is not intended to replace advice given to you by your health care provider. Make sure you discuss any questions you have with your health care provider. Document Released: 05/11/2011 Document Revised: 05/04/2017 Document Reviewed: 05/15/2016 Elsevier Patient Education  2020 [employer].

## 2021-02-08 NOTE — Research (Signed)
Ms. Brittany Mcintyre met criteria for the Virtual care and social determinant interventions for the management of hypertension trial. The trial was discussed with the her including risks/benefits. She was given the opportunity to read the consent and ask questions. The consent was signed and she was given a copy. No protocol related procedures were performed prior to the informed consent. Ms. Brittany Mcintyre was randomized to Group 1-advanced hypertension clinic

## 2021-02-08 NOTE — Progress Notes (Signed)
Advanced Hypertension Clinic Initial Assessment:    Date:  02/08/2021   ID:  Brittany Mcintyre, DOB 1985-12-18, MRN [id number]  PCP:  [facility]  Cardiologist:  None  Nephrologist:  Referring MD: Maxie Better, MD   CC: Hypertension  History of Present Illness:    Brittany Mcintyre is a 35 y.o. female with a hx of hypertension here to establish care in the Advanced Hypertension Clinic.  She notes that she was first diagnosed with hypertension in her teens.  She does not recall having any work-up for why she developed hypertension at such a young age.  She did start taking medications in 2019.  She notes when she takes her medicine is typically fairly well have been controlled.  She was struggling to remember to take it every day but now takes it in the morning every day when she brushes her teeth.  She notes that when her blood pressure is high such as when she has salty foods she gets a headache.  Lately this has not happened since she been taking her medicine more regularly.  She does not get much exercise.  She denies any exertional chest pain or shortness of breath.  She has no lower extremity edema, orthopnea, or PND.  She has been struggling to cook more at home and limit her sodium intake.  She notes that she does not have a lot of fruits and vegetables in her diet.  She does snore and has daytime somnolence.  She does not know if she is had apnea.  She saw Dr. Cherly Hensen and was noted to have a thyroid nodule.  She was sent for an ultrasound and ultimately had a biopsy that she says was unremarkable.  She notes that she is interested in potentially having a baby sometime in the next several years but does not yet have a partner.   Previous antihypertensives: Losartan/HCTZ   Past Medical History:  Diagnosis Date   Asthma    Essential hypertension 02/08/2021   Morbid obesity (HCC) 02/08/2021    History reviewed. No pertinent surgical history.  Current  Medications: Current Meds  Medication Sig   cetirizine (ZYRTEC) 10 MG tablet Take 10 mg by mouth daily as needed for allergies.   metFORMIN (GLUCOPHAGE-XR) 500 MG 24 hr tablet SMARTSIG:1 Tablet(s) By Mouth Every Evening   Multiple Vitamins-Minerals (MULTIVITAMIN ADULT PO) Take 1 tablet by mouth daily.   valsartan-hydrochlorothiazide (DIOVAN-HCT) 320-25 MG tablet Take 1 tablet by mouth daily.   [DISCONTINUED] losartan-hydrochlorothiazide (HYZAAR) 100-25 MG tablet Take 1 tablet by mouth daily.     Allergies:   Patient has no known allergies.   Social History   Socioeconomic History   Marital status: Single    Spouse name: Not on file   Number of children: Not on file   Years of education: Not on file   Highest education level: Not on file  Occupational History   Not on file  Tobacco Use   Smoking status: Never   Smokeless tobacco: Never  Substance and Sexual Activity   Alcohol use: No   Drug use: Not on file   Sexual activity: Not on file  Other Topics Concern   Not on file  Social History Narrative   Not on file   Social Determinants of Health   Financial Resource Strain: Low Risk    Difficulty of Paying Living Expenses: Not hard at all  Food Insecurity: No Food Insecurity   Worried About Running Out of Food in  the Last Year: Never true   Ran Out of Food in the Last Year: Never true  Transportation Needs: No Transportation Needs   Lack of Transportation (Medical): No   Lack of Transportation (Non-Medical): No  Physical Activity: Inactive   Days of Exercise per Week: 0 days   Minutes of Exercise per Session: 0 min  Stress: Not on file  Social Connections: Not on file     Family History: The patient's family history includes Diabetes in her mother; Heart attack in her maternal grandmother and mother; Hypertension in her father, maternal grandfather, maternal grandmother, mother, and paternal grandmother; Stroke in her paternal grandmother.  ROS:   Please see the  history of present illness.     All other systems reviewed and are negative.  EKGs/Labs/Other Studies Reviewed:    EKG: 02/08/21: EKG is sinus rhythm.  Rate 91 bpm.  Low voltage.    Recent Labs: No results found for requested labs within last 8760 hours.   Recent Lipid Panel No results found for: CHOL, TRIG, HDL, CHOLHDL, VLDL, LDLCALC, LDLDIRECT  Physical Exam:   VS:  BP (!) 131/94   Pulse 91   Ht 5\' 7"  (1.702 m)   Wt (!) 323 lb 1.6 oz (146.6 kg)   BMI 50.60 kg/m  , BMI Body mass index is 50.6 kg/m. GENERAL:  Well appearing HEENT: Pupils equal round and reactive, fundi not visualized, oral mucosa unremarkable NECK:  No jugular venous distention, waveform within normal limits, carotid upstroke brisk and symmetric, no bruits LUNGS:  Clear to auscultation bilaterally HEART:  RRR.  PMI not displaced or sustained,S1 and S2 within normal limits, no S3, no S4, no clicks, no rubs, no murmurs ABD:  Flat, positive bowel sounds normal in frequency in pitch, no bruits, no rebound, no guarding, no midline pulsatile mass, no hepatomegaly, no splenomegaly EXT:  2 plus pulses throughout, no edema, no cyanosis no clubbing SKIN:  No rashes no nodules NEURO:  Cranial nerves II through XII grossly intact, motor grossly intact throughout PSYCH:  Cognitively intact, oriented to person place and time   ASSESSMENT/PLAN:    Essential hypertension Her blood pressure is poorly controlled.  Her diastolic is not at goal.  She has been taking losartan/HCTZ.  She is already working on compliance.  She has a system that works for her to be able to remember her medicines.  We will enroll her in the PREP program to the [facility] to increase her exercise to at least 150 minutes weekly.  She will also work on her diet and received an advance hypertension clinic booklet.  She is interested in our remote patient monitoring study and consents to monitoring in the vivify program.  She never had a work-up for early onset of  hypertension.  We will check renal artery Dopplers.  Given that her blood pressure is nearly controlled on 2 agents, no plan to check renin and aldosterone levels.  She had a TSH.  We will get a copy from her endocrinologist.  She does have symptoms concerning for sleep apnea including snoring and daytime somnolence.  We will get a sleep study.  She noted an interest in getting pregnant.  If she finds a partner she will let [facility] know and we will stop her ARB.  For now, we will switch losartan to valsartan 320mg  and continue HCTZ.  Morbid obesity (HCC) Referral to the prep exercise program to the [facility].  Work on increasing exercise to at least 150 minutes weekly.  Screening for Secondary Hypertension:  Causes 02/08/2021  Drugs/Herbals Screened     - Comments limited caffeine.  Renovascular HTN Screened     - Comments check renal Dopplers  Sleep Apnea Screened     - Comments will get sleep study  Thyroid Disease Screened  Hyperaldosteronism N/A  Pheochromocytoma N/A  Cushing's Syndrome N/A  Hyperparathyroidism N/A  Coarctation of the Aorta Screened     - Comments BP symmetric  Compliance Screened     - Comments struggles but she is improving.    Relevant Labs/Studies:                Renovascular  02/08/2021  Renal Artery [place] Completed Yes      Disposition:    FU with MD/PharmD in 1 months    Medication Adjustments/Labs and Tests Ordered: Current medicines are reviewed at length with the patient today.  Concerns regarding medicines are outlined above.  Orders Placed This Encounter  Procedures   Amb Referral To Provider Referral Exercise Program (P.R.E.P)   EKG 12-Lead   VAS US RENAL ARTERY DUPLEX    Meds ordered this encounter  Medications   valsartan-hydrochlorothiazide (DIOVAN-HCT) 320-25 MG tablet    Sig: Take 1 tablet by mouth daily.    Dispense:  90 tablet    Refill:  1      Signed, Chilton Si, MD  02/08/2021 5:02 PM    [facility]

## 2021-02-08 NOTE — Assessment & Plan Note (Signed)
Referral to the prep exercise program to the [facility].  Work on increasing exercise to at least 150 minutes weekly.

## 2021-02-11 ENCOUNTER — Telehealth: Payer: Self-pay

## 2021-02-11 NOTE — Telephone Encounter (Signed)
Called to discuss PREP program referral, left voicemail asking for return call.

## 2021-02-14 ENCOUNTER — Other Ambulatory Visit (HOSPITAL_BASED_OUTPATIENT_CLINIC_OR_DEPARTMENT_OTHER): Payer: Self-pay | Admitting: *Deleted

## 2021-02-14 DIAGNOSIS — R0683 Snoring: Secondary | ICD-10-CM

## 2021-02-14 DIAGNOSIS — R400 Somnolence: Secondary | ICD-10-CM

## 2021-02-17 ENCOUNTER — Encounter (HOSPITAL_BASED_OUTPATIENT_CLINIC_OR_DEPARTMENT_OTHER): Payer: BC Managed Care – PPO

## 2021-02-17 ENCOUNTER — Telehealth: Payer: Self-pay | Admitting: *Deleted

## 2021-02-17 NOTE — Telephone Encounter (Signed)
Left sleep study appointment details on voice mail. 

## 2021-02-17 NOTE — Telephone Encounter (Signed)
-----   Message from Burnell Blanks, LPN sent at 07/05/4386  3:05 PM EDT ----- Sorry is now lol  ----- Message ----- From: Gaynelle Cage, CMA Sent: 02/11/2021   3:14 PM EDT To: Burnell Blanks, LPN  No order is in the system. ----- Message ----- From: Burnell Blanks, LPN Sent: 01/10/5796   5:55 PM EDT To: Burnell Blanks, LPN, C[facility] Div Sleep Studies  Hello all  Patient needs sleep study  Thanks  [place]

## 2021-02-23 ENCOUNTER — Emergency Department
Admission: EM | Admit: 2021-02-23 | Discharge: 2021-02-23 | Disposition: A | Discharge status: Home / Self Care | Payer: BC Managed Care – PPO | Attending: Emergency Medicine | Admitting: Emergency Medicine

## 2021-02-23 ENCOUNTER — Other Ambulatory Visit: Payer: Self-pay

## 2021-02-23 ENCOUNTER — Encounter: Payer: Self-pay | Admitting: Emergency Medicine

## 2021-02-23 DIAGNOSIS — Y92410 Unspecified street and highway as the place of occurrence of the external cause: Secondary | ICD-10-CM | POA: Insufficient documentation

## 2021-02-23 DIAGNOSIS — Z79899 Other long term (current) drug therapy: Secondary | ICD-10-CM | POA: Insufficient documentation

## 2021-02-23 DIAGNOSIS — S3992XA Unspecified injury of lower back, initial encounter: Secondary | ICD-10-CM | POA: Diagnosis not present

## 2021-02-23 DIAGNOSIS — J45909 Unspecified asthma, uncomplicated: Secondary | ICD-10-CM | POA: Insufficient documentation

## 2021-02-23 DIAGNOSIS — I10 Essential (primary) hypertension: Secondary | ICD-10-CM | POA: Diagnosis not present

## 2021-02-23 DIAGNOSIS — S39012A Strain of muscle, fascia and tendon of lower back, initial encounter: Secondary | ICD-10-CM | POA: Diagnosis not present

## 2021-02-23 DIAGNOSIS — M79601 Pain in right arm: Secondary | ICD-10-CM | POA: Insufficient documentation

## 2021-02-23 MED ORDER — NAPROXEN 375 MG PO TABS: 375.0000 mg | ORAL_TABLET | Freq: Two times a day (BID) | ORAL | 0 refills | Status: DC

## 2021-02-23 NOTE — ED Notes (Signed)
ED Provider at bedside. 
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
## 2021-02-28 ENCOUNTER — Encounter (HOSPITAL_BASED_OUTPATIENT_CLINIC_OR_DEPARTMENT_OTHER): Payer: BC Managed Care – PPO

## 2021-03-14 ENCOUNTER — Other Ambulatory Visit: Payer: Self-pay

## 2021-03-14 ENCOUNTER — Ambulatory Visit (INDEPENDENT_AMBULATORY_CARE_PROVIDER_SITE_OTHER): Payer: BC Managed Care – PPO | Admitting: Pharmacist Clinician (PhC)/ Clinical Pharmacy Specialist

## 2021-03-14 VITALS — BP 136/95 | HR 84 | Resp 17 | Ht 67.0 in | Wt 325.8 lb

## 2021-03-14 DIAGNOSIS — I10 Essential (primary) hypertension: Secondary | ICD-10-CM

## 2021-03-14 LAB — BASIC METABOLIC PANEL
BUN/Creatinine Ratio: 11 (ref 9–23)
BUN: 9 mg/dL (ref 6–20)
CO2: 24 mmol/L (ref 20–29)
Calcium: 9.4 mg/dL (ref 8.7–10.2)
Chloride: 102 mmol/L (ref 96–106)
Creatinine, Ser: 0.82 mg/dL (ref 0.57–1.00)
Glucose: 86 mg/dL (ref 70–99)
Potassium: 3.6 mmol/L (ref 3.5–5.2)
Sodium: 138 mmol/L (ref 134–144)
eGFR: 97 mL/min/{1.73_m2} (ref >59)
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
## 2021-03-14 NOTE — Patient Instructions (Addendum)
Return for a a follow up appointment November 7 at 10:30 am  Go to the lab today to check kidney function  Check your blood pressure at home daily and keep record of the readings.  Take your BP meds as follows:  Start valsartan hctz 320/25 mg once daily in the mornings  Bring all of your meds, your BP cuff and your record of home blood pressures to your next appointment.  Exercise as you're able, try to walk approximately 30 minutes per day.  Keep salt intake to a minimum, especially watch canned and prepared boxed foods.  Eat more fresh fruits and vegetables and fewer canned items.  Avoid eating in fast food restaurants.    HOW TO TAKE YOUR BLOOD PRESSURE: Rest 5 minutes before taking your blood pressure.  Don't smoke or drink caffeinated beverages for at least 30 minutes before. Take your blood pressure before (not after) you eat. Sit comfortably with your back supported and both feet on the floor (don't cross your legs). Elevate your arm to heart level on a table or a desk. Use the proper sized cuff. It should fit smoothly and snugly around your bare upper arm. There should be enough room to slip a fingertip under the cuff. The bottom edge of the cuff should be 1 inch above the crease of the elbow. Ideally, take 3 measurements at one sitting and record the average.

## 2021-03-14 NOTE — Assessment & Plan Note (Signed)
Patient with essential hypertension, with both systolic and diastolic elevations.  She has not yet started valsartan hctz, so will make no changes, as she is to start that tomorrow.  Will draw BMET today, as we have no baseline labs for her.  Reviewed information regarding diagnosis and need to treat aggressively from early age to prevent long term heart damage.  Patient states compliance with medications, so encouraged her to continue with this.  We will see her back in 1 month for follow up.

## 2021-03-15 ENCOUNTER — Ambulatory Visit (INDEPENDENT_AMBULATORY_CARE_PROVIDER_SITE_OTHER): Payer: BC Managed Care – PPO

## 2021-03-15 DIAGNOSIS — I10 Essential (primary) hypertension: Secondary | ICD-10-CM

## 2021-03-17 ENCOUNTER — Telehealth: Payer: Self-pay

## 2021-03-17 NOTE — Telephone Encounter (Signed)
Returned my call re: PREP program; she prefers afternoon at [employer] ask Pam RN [facility] to contact her about next classes at [place]

## 2021-04-07 ENCOUNTER — Encounter (HOSPITAL_BASED_OUTPATIENT_CLINIC_OR_DEPARTMENT_OTHER): Payer: BC Managed Care – PPO | Admitting: Cardiovascular Disease

## 2021-04-08 ENCOUNTER — Telehealth: Payer: Self-pay

## 2021-04-08 NOTE — Telephone Encounter (Signed)
VMT pt requesting call back to discuss referral to PREP and next class starting 05/09/21

## 2021-04-11 ENCOUNTER — Ambulatory Visit (INDEPENDENT_AMBULATORY_CARE_PROVIDER_SITE_OTHER): Payer: BC Managed Care – PPO | Admitting: Pharmacist Clinician (PhC)/ Clinical Pharmacy Specialist

## 2021-04-11 ENCOUNTER — Other Ambulatory Visit: Payer: Self-pay

## 2021-04-11 ENCOUNTER — Encounter: Payer: Self-pay | Admitting: Pharmacist Clinician (PhC)/ Clinical Pharmacy Specialist

## 2021-04-11 DIAGNOSIS — I10 Essential (primary) hypertension: Secondary | ICD-10-CM

## 2021-04-11 MED ORDER — AMLODIPINE BESYLATE 5 MG PO TABS: 5.0000 mg | ORAL_TABLET | Freq: Every day | ORAL | 1 refills | Status: DC

## 2021-04-11 NOTE — Progress Notes (Signed)
04/11/2021 Brittany Mcintyre 08/10/1985 [id number]   HPI:  Brittany Mcintyre is a 35 y.o. female patient of Dr Duke Salvia, who presents today for advanced hypertension clinic follow up.  In addition to hypertension, her medical history is significant for pre-diabetes and obesity.  She was seen by Dr. Duke Salvia in September and agreed to be a part of our [facility] monitoring program.  We will see her monthly x 3 visits, then have her follow up with Dr. Duke Salvia.   At that visit she noted having been first diagnosed with hypertension as as teen, but did not start taking any medications until 2019.   She admitted to challenges with compliance, but has recently found a routine that is working for her.  She also notes that she would like to have children in the next several years, however does not currently have a partner.   At her last visit she had not yet switched from losartan to valsartan hctz, but was down to 1-2 tablets of losartan.  She had mostly diastolic hypertension at 136/95.  No changes were made other than to have her start the valsartan.    Today she is in the office for follow up.    Blood Pressure Goal:  130/80  Current Medications: valsartan hctz 320/25 (will start tomorrow)  Family Hx: both parents with hypertenison, mom died 67 MI (34), maternal aunt died MI (late 42's), mgm died MI (47); no siblings  Social Hx: no tobacco, rare social drink, occasional coffee home brewed, some Pepsi  Diet: has been working to improve eating habits, now eating out only 1-2 times per week, trying to stay consistent with fruits and vegetables.    Exercise: Pam left message to call back for PREP; hopeful to start December class  Home BP readings: [facility] - Welch Allyn cuff  (home cuff read within 10 points of office cuff today)  15 AM readings average 143/84 (last month average 144/93)   9 PM readings average 132/77 (last month average 143/87)  Intolerances:  nkda  Labs: 10/22:  Na 138, K 3.6,  Glu 86, BUN 9, SCr 0.82 GFR 97   Wt Readings from Last 3 Encounters:  04/11/21 (!) 321 lb (145.6 kg)  03/14/21 (!) 325 lb 12.8 oz (147.8 kg)  02/23/21 (!) 321 lb 14 oz (146 kg)   BP Readings from Last 3 Encounters:  04/11/21 118/78  03/14/21 (!) 136/95  02/23/21 130/90   Pulse Readings from Last 3 Encounters:  04/11/21 80  03/14/21 84  02/23/21 (!) 107    Current Outpatient Medications  Medication Sig Dispense Refill   amLODipine (NORVASC) 5 MG tablet Take 1 tablet (5 mg total) by mouth daily. 90 tablet 1   cetirizine (ZYRTEC) 10 MG tablet Take 10 mg by mouth daily as needed for allergies.     metFORMIN (GLUCOPHAGE-XR) 500 MG 24 hr tablet SMARTSIG:1 Tablet(s) By Mouth Every Evening     Multiple Vitamins-Minerals (MULTIVITAMIN ADULT PO) Take 1 tablet by mouth daily.     valsartan-hydrochlorothiazide (DIOVAN-HCT) 320-25 MG tablet Take 1 tablet by mouth daily. 90 tablet 1   No current facility-administered medications for this visit.    Allergies  Allergen Reactions   Shellfish Allergy Itching    Past Medical History:  Diagnosis Date   Asthma    Essential hypertension 02/08/2021   Morbid obesity (HCC) 02/08/2021    Blood pressure 118/78, pulse 80, resp. rate 16, height 5\' 7"  (1.702 m), weight (!) 321 lb (145.6 kg), SpO2  97 %.  Essential hypertension Patient with mixed systolic and diastolic hypertension, doing better since switched losartan/hctz  to valsartan/hctz.  Still not at goal, so will add amlodipine 5 mg daily.  Since her evening readings are 11/7 points lower on average, will have her take amlodipine at night to see if this will help lower the morning readings.  She will continue to check home BP and we will see her back in 1 month for follow up.     Phillips Hay PharmD CPP [facility] [address] [place], [place] [address] [phone]

## 2021-04-11 NOTE — Patient Instructions (Signed)
Return for a a follow up appointment December 5 at 10:30 am  Check your blood pressure at home daily and keep record of the readings.  Take your BP meds as follows:  Start amlodipine 5 mg once daily in the evenings  Continue valsartan/hctz 320/25 once daily in the mornings  Bring all of your meds, your BP cuff and your record of home blood pressures to your next appointment.  Exercise as you're able, try to walk approximately 30 minutes per day.  Keep salt intake to a minimum, especially watch canned and prepared boxed foods.  Eat more fresh fruits and vegetables and fewer canned items.  Avoid eating in fast food restaurants.    HOW TO TAKE YOUR BLOOD PRESSURE: Rest 5 minutes before taking your blood pressure.  Don't smoke or drink caffeinated beverages for at least 30 minutes before. Take your blood pressure before (not after) you eat. Sit comfortably with your back supported and both feet on the floor (don't cross your legs). Elevate your arm to heart level on a table or a desk. Use the proper sized cuff. It should fit smoothly and snugly around your bare upper arm. There should be enough room to slip a fingertip under the cuff. The bottom edge of the cuff should be 1 inch above the crease of the elbow. Ideally, take 3 measurements at one sitting and record the average.

## 2021-04-11 NOTE — Assessment & Plan Note (Signed)
Patient with mixed systolic and diastolic hypertension, doing better since switched losartan/hctz  to valsartan/hctz.  Still not at goal, so will add amlodipine 5 mg daily.  Since her evening readings are 11/7 points lower on average, will have her take amlodipine at night to see if this will help lower the morning readings.  She will continue to check home BP and we will see her back in 1 month for follow up.

## 2021-04-15 ENCOUNTER — Telehealth: Payer: Self-pay

## 2021-04-15 NOTE — Telephone Encounter (Signed)
Text pt to request call back to schedule initial assessment for PREP starting on 05/09/21

## 2021-05-04 NOTE — Progress Notes (Signed)
[facility] PREP Evaluation  Patient Details  Name: Brittany Mcintyre MRN: [id number] Date of Birth: 02/12/1986 Age: 35 y.o. PCP: [facility]  Vitals:   05/04/21 0920  BP: 108/88  Pulse: 80  SpO2: 97%  Weight: (!) 318 lb (144.2 kg)     [facility] Eval - 05/04/21 1200       [facility] "PREP" Location   [facility] "PREP" Location [facility]      Referral    Referring Provider Duke Salvia    Reason for referral Hypertension    Program Start Date 05/09/21   MW 230p-345p x 12 wks     Measurement   Waist Circumference 54 inches    Hip Circumference 56.5 inches    Body fat 47.7 percent      Information for Trainer   Goals Wants to be smaller overall and move easier    Current Exercise walks intermittently    Orthopedic Concerns none    Pertinent Medical History HTN    Current Barriers none    Medications that affect exercise Medication causing dizziness/drowsiness      Timed Up and Go (TUGS)   Timed Up and Go Low risk <9 seconds      Mobility and Daily Activities   I find it easy to walk up or down two or more flights of stairs. 2    I have no trouble taking out the trash. 4    I do housework such as vacuuming and dusting on my own without difficulty. 3    I can easily lift a gallon of milk (8lbs). 4    I can easily walk a mile. 2    I have no trouble reaching into high cupboards or reaching down to pick up something from the floor. 3    I do not have trouble doing out-door work such as Loss adjuster, chartered, raking leaves, or gardening. 2      Mobility and Daily Activities   I feel younger than my age. 2    I feel independent. 3    I feel energetic. 2    I live an active life.  1    I feel strong. 2    I feel healthy. 3    I feel active as other people my age. 2      How fit and strong are you.   Fit and Strong Total Score 35            Past Medical History:  Diagnosis Date   Asthma    Essential hypertension 02/08/2021   Morbid obesity (HCC) 02/08/2021   No  past surgical history on file. Social History   Tobacco Use  Smoking Status Never  Smokeless Tobacco Never    [name] 05/04/2021, 12:09 PM

## 2021-05-09 ENCOUNTER — Other Ambulatory Visit: Payer: Self-pay

## 2021-05-09 ENCOUNTER — Ambulatory Visit: Payer: BC Managed Care – PPO | Admitting: Pharmacist Clinician (PhC)/ Clinical Pharmacy Specialist

## 2021-05-09 DIAGNOSIS — I10 Essential (primary) hypertension: Secondary | ICD-10-CM

## 2021-05-09 NOTE — Patient Instructions (Signed)
Return for a a follow up appointment with Dr. Duke Salvia on January 19  Check your blood pressure at home daily and keep record of the readings.  Take your BP meds as follows:  Continue with all current medication  Bring all of your meds, your BP cuff and your record of home blood pressures to your next appointment.  Exercise as you're able, try to walk approximately 30 minutes per day.  Keep salt intake to a minimum, especially watch canned and prepared boxed foods.  Eat more fresh fruits and vegetables and fewer canned items.  Avoid eating in fast food restaurants.    HOW TO TAKE YOUR BLOOD PRESSURE: Rest 5 minutes before taking your blood pressure.  Don't smoke or drink caffeinated beverages for at least 30 minutes before. Take your blood pressure before (not after) you eat. Sit comfortably with your back supported and both feet on the floor (don't cross your legs). Elevate your arm to heart level on a table or a desk. Use the proper sized cuff. It should fit smoothly and snugly around your bare upper arm. There should be enough room to slip a fingertip under the cuff. The bottom edge of the cuff should be 1 inch above the crease of the elbow. Ideally, take 3 measurements at one sitting and record the average.

## 2021-05-09 NOTE — Progress Notes (Signed)
05/09/2021 Brittany Mcintyre 19-Jun-1985 [id number]   HPI:  Brittany Mcintyre is a 35 y.o. female patient of Dr Brittany Mcintyre, who presents today for advanced hypertension clinic follow up.  In addition to hypertension, her medical history is significant for pre-diabetes and obesity.  She was seen by Dr. Duke Mcintyre in September and agreed to be a part of our [facility] monitoring program.  We will see her monthly x 3 visits, then have her follow up with Dr. Duke Mcintyre.   At that visit she noted having been first diagnosed with hypertension as as teen, but did not start taking any medications until 2019.   She admitted to challenges with compliance, but has recently found a routine that is working for her.  She also notes that she would like to have children in the next several years, however does not currently have a partner.   At her last visit she had not yet switched from losartan to valsartan hctz, but was down to 1-2 tablets of losartan.  She had mostly diastolic hypertension at 136/95.  No changes were made other than to have her start the valsartan.    Today she is in the office for follow up.  Her home BP readings have improved since switching to valsartan, although morning readings are still higher than evening.    Blood Pressure Goal:  130/80  Current Medications: valsartan hctz 320/25 (will start tomorrow), amlodipine 5 mg hs  Family Hx: both parents with hypertenison, mom died 61 MI (33), maternal aunt died MI (late 51's), mgm died MI (64); no siblings  Social Hx: no tobacco, rare social drink, occasional coffee home brewed, some Pepsi  Diet: has been working to improve eating habits, now eating out only 1-2 times per week, trying to stay consistent with fruits and vegetables.    Exercise: Starts PREP this week  Home BP readings: [facility] - Welch Allyn cuff  (home cuff read within 10 points of office cuff today)  29 AM readings average 136/82   (last month average 143/82)  20 PM readings  average 121/71   (last month average 132/77)  Intolerances:  nkda  Labs: 10/22:  Na 138, K 3.6, Glu 86, BUN 9, SCr 0.82 GFR 97   Wt Readings from Last 3 Encounters:  05/09/21 (!) 325 lb 3.2 oz (147.5 kg)  05/04/21 (!) 318 lb (144.2 kg)  04/11/21 (!) 321 lb (145.6 kg)   BP Readings from Last 3 Encounters:  05/09/21 102/71  05/04/21 108/88  04/11/21 118/78   Pulse Readings from Last 3 Encounters:  05/09/21 80  05/04/21 80  04/11/21 80    Current Outpatient Medications  Medication Sig Dispense Refill   amLODipine (NORVASC) 5 MG tablet Take 1 tablet (5 mg total) by mouth daily. 90 tablet 1   cetirizine (ZYRTEC) 10 MG tablet Take 10 mg by mouth daily as needed for allergies.     metFORMIN (GLUCOPHAGE-XR) 500 MG 24 hr tablet SMARTSIG:1 Tablet(s) By Mouth Every Evening     Multiple Vitamins-Minerals (MULTIVITAMIN ADULT PO) Take 1 tablet by mouth daily.     valsartan-hydrochlorothiazide (DIOVAN-HCT) 320-25 MG tablet Take 1 tablet by mouth daily. 90 tablet 1   No current facility-administered medications for this visit.    Allergies  Allergen Reactions   Shellfish Allergy Itching    Past Medical History:  Diagnosis Date   Asthma    Essential hypertension 02/08/2021   Morbid obesity (HCC) 02/08/2021    Blood pressure 102/71, pulse 80, resp. rate  17, height 5\' 7"  (1.702 m), weight (!) 325 lb 3.2 oz (147.5 kg), last menstrual period 04/20/2021, SpO2 98 %.  Essential hypertension Patient with essential hypertension, currently doing well with valsartan/hctz and amlodipine.   For now will make no changes in her medications.  She has a sleep study scheduled for later this month, and starts the PREP exercise program tonight.  She was encouraged to keep working on lifestyle modifications as a way to manage BP.  She is scheduled to see Dr. 04/22/2021 next month as part of the Brittany Mcintyre.  If her pressure is still more elevated in the mornings, may consider dividing valsartan hctz  tablet in order to give chlorthalidone, or increase amlodipine.   [employer] PharmD CPP [facility] [address] [place], [place] [phone]

## 2021-05-09 NOTE — Assessment & Plan Note (Signed)
Patient with essential hypertension, currently doing well with valsartan/hctz and amlodipine.   For now will make no changes in her medications.  She has a sleep study scheduled for later this month, and starts the PREP exercise program tonight.  She was encouraged to keep working on lifestyle modifications as a way to manage BP.  She is scheduled to see Dr. Duke Salvia next month as part of the [employer].  If her pressure is still more elevated in the mornings, may consider dividing valsartan hctz tablet in order to give chlorthalidone, or increase amlodipine.

## 2021-05-22 ENCOUNTER — Ambulatory Visit (HOSPITAL_BASED_OUTPATIENT_CLINIC_OR_DEPARTMENT_OTHER): Payer: BC Managed Care – PPO | Attending: Cardiovascular Disease | Admitting: Cardiovascular Disease

## 2021-05-22 ENCOUNTER — Other Ambulatory Visit: Payer: Self-pay

## 2021-05-22 DIAGNOSIS — E669 Obesity, unspecified: Secondary | ICD-10-CM | POA: Diagnosis not present

## 2021-05-22 DIAGNOSIS — I10 Essential (primary) hypertension: Secondary | ICD-10-CM | POA: Insufficient documentation

## 2021-05-22 DIAGNOSIS — G479 Sleep disorder, unspecified: Secondary | ICD-10-CM | POA: Diagnosis not present

## 2021-05-22 DIAGNOSIS — G4733 Obstructive sleep apnea (adult) (pediatric): Secondary | ICD-10-CM | POA: Insufficient documentation

## 2021-05-22 DIAGNOSIS — R0683 Snoring: Secondary | ICD-10-CM | POA: Insufficient documentation

## 2021-05-22 DIAGNOSIS — R400 Somnolence: Secondary | ICD-10-CM | POA: Insufficient documentation

## 2021-06-04 ENCOUNTER — Encounter (HOSPITAL_BASED_OUTPATIENT_CLINIC_OR_DEPARTMENT_OTHER): Payer: Self-pay | Admitting: Cardiovascular Disease

## 2021-06-04 NOTE — Procedures (Signed)
° ° ° °  Patient Name: Brittany Mcintyre, [place] Date: 05/22/2021 Gender: Female D.O.B: Dec 07, 1985 Age (years): 35 Referring Provider: Chilton Si Height (inches): 67 Interpreting Physician: Nicki Guadalajara MD, ABSM Weight (lbs): 325 RPSGT: Rosette Reveal BMI: 51 MRN: [id number] Neck Size: 17.00  CLINICAL INFORMATION Sleep Study Type: NPSG  Indication for sleep study: Hypertension, Obesity  Epworth Sleepiness Score: 5  SLEEP STUDY TECHNIQUE As per the AASM Manual for the Scoring of Sleep and Associated Events v2.3 (April 2016) with a hypopnea requiring 4% desaturations.  The channels recorded and monitored were frontal, central and occipital EEG, electrooculogram (EOG), submentalis EMG (chin), nasal and oral airflow, thoracic and abdominal wall motion, anterior tibialis EMG, snore microphone, electrocardiogram, and pulse oximetry.  MEDICATIONS amLODipine (NORVASC) 5 MG tablet cetirizine (ZYRTEC) 10 MG tablet metFORMIN (GLUCOPHAGE-XR) 500 MG 24 hr tablet Multiple Vitamins-Minerals (MULTIVITAMIN ADULT PO) valsartan-hydrochlorothiazide (DIOVAN-HCT) 320-25 MG tablet  Medications self-administered by patient taken the night of the study : N/A  SLEEP ARCHITECTURE The study was initiated at 11:02:27 PM and ended at 5:05:25 AM.  Sleep onset time was 95.3 minutes and the sleep efficiency was 66.9%%. The total sleep time was 243 minutes.  Stage REM latency was 108.0 minutes.  The patient spent 10.3%% of the night in stage N1 sleep, 69.8%% in stage N2 sleep, 0.0%% in stage N3 and 20% in REM.  Alpha intrusion was absent.  Supine sleep was 62.76%.  RESPIRATORY PARAMETERS The overall apnea/hypopnea index (AHI) was 27.9 per hour. The respiratory disturbance indec (RDI) was 29.4/h. There were 4 total apneas, including 4 obstructive, 0 central and 0 mixed apneas. There were 109 hypopneas and 6 RERAs.  The AHI during Stage REM sleep was 81.6 per hour.  AHI while supine was 25.2  per hour.  The mean oxygen saturation was 93.6%. The minimum SpO2 during sleep was 80.0%.  Loud snoring was noted during this study.  CARDIAC DATA The 2 lead EKG demonstrated sinus rhythm. The mean heart rate was 85.1 beats per minute. Other EKG findings include: None.  LEG MOVEMENT DATA The total PLMS were 0 with a resulting PLMS index of 0.0. Associated arousal with leg movement index was 0.0 .  IMPRESSIONS - Moderate obstructive sleep apnea occurred during this study (AHI 27.9/h; RDI 29.4/h); however, events were very severe duiring REM sleep (AHI 81.6/h). - Moderate oxygen desaturation to a nadir of 80.0%. - The patient snored with loud snoring volume. - No cardiac abnormalities were noted during this study. - Clinically significant periodic limb movements did not occur during sleep. No significant associated arousals.  DIAGNOSIS - Obstructive Sleep Apnea (G47.33) - Nocturnal Hypoxemia (G47.36)  RECOMMENDATIONS - In this patient with significant cardiovascular co-miorbidities recommend an in-lab therapeutic CPAP titration to determine optimal pressure required to alleviate sleep disordered breathing. If unable to have an in-lab study, initiate Auto-PAP with EPR of 3 at 7 - 20 cm of water. - Effort should be made to optimize nasal and oropharyngeal patency. - Avoid alcohol, sedatives and other CNS depressants that may worsen sleep apnea and disrupt normal sleep architecture. - Sleep hygiene should be reviewed to assess factors that may improve sleep quality. - Weight management (BMI 51) and regular exercise should be initiated or continued if appropriate.  [Electronically signed] 06/04/2021 03:38 PM  Nicki Guadalajara MD, [facility], ABSM Diplomate, American Board of Sleep Medicine   NPI: [id number] [facility] PH: [phone]   FX: [phone] ACCREDITED BY THE AMERICAN ACADEMY OF SLEEP MEDICINE

## 2021-06-07 NOTE — Progress Notes (Signed)
[facility] PREP Weekly Session  Patient Details  Name: Brittany Mcintyre MRN: [id number] Date of Birth: 04/02/1986 Age: 36 y.o. PCP: [facility]  There were no vitals filed for this visit.   [facility] Weekly seesion - 06/07/21 1600       [facility] "PREP" Location   [facility]PREP" Location [facility]      Weekly Session   Topic Discussed Healthy eating tips   part 2, sugar demo   Minutes exercised this week 35 minutes    Classes attended to date [place] 06/07/2021, 4:48 PM

## 2021-06-14 DIAGNOSIS — Z20822 Contact with and (suspected) exposure to covid-19: Secondary | ICD-10-CM | POA: Diagnosis not present

## 2021-06-14 NOTE — Progress Notes (Signed)
[facility] PREP Weekly Session  Patient Details  Name: Brittany Mcintyre MRN: [id number] Date of Birth: 26-Mar-1986 Age: 36 y.o. PCP: [facility]  There were no vitals filed for this visit.   [facility] Weekly seesion - 06/14/21 1700       [facility]PREP" Location   [facility]      Weekly Session   Topic Discussed Health habits    Minutes exercised this week 25 minutes    Classes attended to date 9           Class held on 06/13/21  [name] 06/14/2021, 5:30 PM

## 2021-06-16 ENCOUNTER — Telehealth: Payer: Self-pay | Admitting: *Deleted

## 2021-06-16 NOTE — Telephone Encounter (Signed)
Left message to return a call to discuss sleep study results and recommendations. 

## 2021-06-16 NOTE — Telephone Encounter (Signed)
-----   Message from Lennette Bihari, MD sent at 06/04/2021  3:44 PM EST ----- Burna Mortimer, please notify pt and set up for CPAP titration study, if unable then Auto PAP

## 2021-06-23 ENCOUNTER — Ambulatory Visit (INDEPENDENT_AMBULATORY_CARE_PROVIDER_SITE_OTHER): Payer: BC Managed Care – PPO | Admitting: Cardiovascular Disease

## 2021-06-23 ENCOUNTER — Other Ambulatory Visit: Payer: Self-pay

## 2021-06-23 ENCOUNTER — Encounter (HOSPITAL_BASED_OUTPATIENT_CLINIC_OR_DEPARTMENT_OTHER): Payer: Self-pay | Admitting: *Deleted

## 2021-06-23 ENCOUNTER — Encounter (HOSPITAL_BASED_OUTPATIENT_CLINIC_OR_DEPARTMENT_OTHER): Payer: Self-pay | Admitting: Cardiovascular Disease

## 2021-06-23 DIAGNOSIS — Z006 Encounter for examination for normal comparison and control in clinical research program: Secondary | ICD-10-CM

## 2021-06-23 DIAGNOSIS — G4733 Obstructive sleep apnea (adult) (pediatric): Secondary | ICD-10-CM | POA: Diagnosis not present

## 2021-06-23 DIAGNOSIS — I10 Essential (primary) hypertension: Secondary | ICD-10-CM | POA: Diagnosis not present

## 2021-06-23 HISTORY — DX: Obstructive sleep apnea (adult) (pediatric): G47.33

## 2021-06-23 NOTE — Research (Signed)
I saw pt today after Dr. Perham's follow up visit. Pt is in Dr. [place]'s Virtual care HTN study. Pt filled out research survey. Pt was enrolled in group 1.  °

## 2021-06-23 NOTE — Telephone Encounter (Signed)
This encounter was created in error - please disregard.

## 2021-06-23 NOTE — Patient Instructions (Signed)
Medication Instructions:  Your physician recommends that you continue on your current medications as directed. Please refer to the Current Medication list given to you today.   Labwork: NONE   Testing/Procedures: NONE   Follow-Up: 11/28/2021 10:30 AM WITH JESSE C NP

## 2021-06-23 NOTE — Assessment & Plan Note (Signed)
Sleep study was positive for OSA.  She needs a CPAP titration.  We will reach out to the sleep medicine team.

## 2021-06-23 NOTE — Assessment & Plan Note (Addendum)
Blood pressure has been much better controlled.  She is doing a good job with her lifestyle changes.  Continue with regular exercise and limiting sodium intake.  Continue current medications of amlodipine, valsartan, and HCTZ.  She was found to have sleep apnea.  She needs a CPAP titration so we will reach out to our team for that.  She has completed our remote patient monitoring study and will be unenrolled.

## 2021-06-23 NOTE — Progress Notes (Signed)
Advanced Hypertension Clinic:    Date:  06/23/2021   ID:  Brittany Mcintyre, DOB Apr 13, 1986, MRN [id number]  PCP:  [facility]  Cardiologist:  None  Nephrologist:  Referring MD: [facility]   CC: Hypertension  History of Present Illness:    Brittany Mcintyre is a 36 y.o. female with a hx of hypertension here for follow-up.  She noted that she was first diagnosed with hypertension in her teens.  She did not recall having any work-up for why she developed hypertension at such a young age.  She started taking medications in 2019.  She noted that she is interested in potentially having a baby sometime in the next several years but does not yet have a partner.  At her first visit, her blood pressure was 131/94. She was enrolled in the PREP program at the [facility] She had renal artery dopplers 03/2021 that were normal. She was also referred for a sleep study. We discussed switching off of an ARB if she did decide to become pregnant. Given she did not have a partner at the time, she was switched from losartan to valsartan.   Today, she has been doing well with no major concerns. At home, her blood pressure runs similar to the measurement in clinic. She started the PREP program in December and tries to be consistent. She has been feeling more energetic and is trying to improve her eating habits. She is tolerating her current medications. She denies any palpitations, chest pain, or shortness of breath, lightheadedness, headaches, syncope, orthopnea, PND, lower extremity edema or exertional symptoms.  Previous antihypertensives: Losartan/HCTZ   Past Medical History:  Diagnosis Date   Asthma    Essential hypertension 02/08/2021   Morbid obesity (HCC) 02/08/2021   OSA (obstructive sleep apnea) 06/23/2021    History reviewed. No pertinent surgical history.  Current Medications: Current Meds  Medication Sig   amLODipine (NORVASC) 5 MG tablet Take 1 tablet (5 mg  total) by mouth daily.   cetirizine (ZYRTEC) 10 MG tablet Take 10 mg by mouth daily as needed for allergies.   metFORMIN (GLUCOPHAGE-XR) 500 MG 24 hr tablet SMARTSIG:1 Tablet(s) By Mouth Every Evening   Multiple Vitamins-Minerals (MULTIVITAMIN ADULT PO) Take 1 tablet by mouth daily.   valsartan-hydrochlorothiazide (DIOVAN-HCT) 320-25 MG tablet Take 1 tablet by mouth daily.     Allergies:   Shellfish allergy   Social History   Socioeconomic History   Marital status: Single    Spouse name: Not on file   Number of children: Not on file   Years of education: Not on file   Highest education level: Not on file  Occupational History   Not on file  Tobacco Use   Smoking status: Never   Smokeless tobacco: Never  Substance and Sexual Activity   Alcohol use: No   Drug use: Not on file   Sexual activity: Not on file  Other Topics Concern   Not on file  Social History Narrative   Not on file   Social Determinants of Health   Financial Resource Strain: Low Risk    Difficulty of Paying Living Expenses: Not hard at all  Food Insecurity: No Food Insecurity   Worried About Running Out of Food in the Last Year: Never true   Ran Out of Food in the Last Year: Never true  Transportation Needs: No Transportation Needs   Lack of Transportation (Medical): No   Lack of Transportation (Non-Medical): No  Physical Activity:  Inactive   Days of Exercise per Week: 0 days   Minutes of Exercise per Session: 0 min  Stress: Not on file  Social Connections: Not on file     Family History: The patient's family history includes Diabetes in her mother; Heart attack in her maternal grandmother and mother; Hypertension in her father, maternal grandfather, maternal grandmother, mother, and paternal grandmother; Stroke in her paternal grandmother.  ROS:   Please see the history of present illness.    All other systems reviewed and are negative.  EKGs/Labs/Other Studies Reviewed:    Renal Artery  03/15/21 Largest Aortic Diameter: 2.4 cm  Renal:     Right: Normal size right kidney. Normal right Resisitive Index.         Normal cortical thickness of right kidney. No evidence of         right renal artery stenosis. RRV flow present.  Left:  Normal size of left kidney. Normal left Resistive Index.         Normal cortical thickness of the left kidney. No evidence of         left renal artery stenosis. LRV flow present.  Mesenteric:  Normal Celiac artery and Superior Mesenteric artery findings.   EKG: EKG was not ordered today 02/08/21: EKG is sinus rhythm.  Rate 91 bpm.  Low voltage.    Recent Labs: 03/14/2021: BUN 9; Creatinine, Ser 0.82; Potassium 3.6; Sodium 138   Recent Lipid Panel No results found for: CHOL, TRIG, HDL, CHOLHDL, VLDL, LDLCALC, LDLDIRECT  Physical Exam:   VS:  BP 121/82 (BP Location: Right Arm, Patient Position: Sitting, Cuff Size: Large)    Pulse 74    Ht 5\' 7"  (1.702 m)    Wt (!) 333 lb 1.6 oz (151.1 kg)    SpO2 95%    BMI 52.17 kg/m  , BMI Body mass index is 52.17 kg/m. GENERAL:  Well appearing HEENT: Pupils equal round and reactive, fundi not visualized, oral mucosa unremarkable NECK:  No jugular venous distention, waveform within normal limits, carotid upstroke brisk and symmetric, no bruits LUNGS:  Clear to auscultation bilaterally HEART:  RRR.  PMI not displaced or sustained,S1 and S2 within normal limits, no S3, no S4, no clicks, no rubs, no murmurs ABD:  Flat, positive bowel sounds normal in frequency in pitch, no bruits, no rebound, no guarding, no midline pulsatile mass, no hepatomegaly, no splenomegaly EXT:  2 plus pulses throughout, no edema, no cyanosis no clubbing SKIN:  No rashes no nodules NEURO:  Cranial nerves II through XII grossly intact, motor grossly intact throughout PSYCH:  Cognitively intact, oriented to person place and time   ASSESSMENT/PLAN:    Essential hypertension Blood pressure has been much better controlled.  She is  doing a good job with her lifestyle changes.  Continue with regular exercise and limiting sodium intake.  Continue current medications of amlodipine, valsartan, and HCTZ.  She was found to have sleep apnea.  She needs a CPAP titration so we will reach out to our team for that.  She has completed our remote patient monitoring study and will be unenrolled.  OSA (obstructive sleep apnea) Sleep study was positive for OSA.  She needs a CPAP titration.  We will reach out to the sleep medicine team.    Screening for Secondary Hypertension:  Causes 02/08/2021  Drugs/Herbals Screened     - Comments limited caffeine.  Renovascular HTN Screened     - Comments check renal Dopplers  Sleep Apnea Screened     -  Comments will get sleep study  Thyroid Disease Screened  Hyperaldosteronism N/A  Pheochromocytoma N/A  Cushing's Syndrome N/A  Hyperparathyroidism N/A  Coarctation of the Aorta Screened     - Comments BP symmetric  Compliance Screened     - Comments struggles but she is improving.    Relevant Labs/Studies: Basic Labs Latest Ref Rng & Units 03/14/2021  Sodium 134 - 144 mmol/L 138  Potassium 3.5 - 5.2 mmol/L 3.6  Creatinine 0.57 - 1.00 mg/dL 0.82                Renovascular  03/15/2021  Renal Artery [place] Completed Yes     Disposition: FU with APP in 6 months   Medication Adjustments/Labs and Tests Ordered: Current medicines are reviewed at length with the patient today.  Concerns regarding medicines are outlined above.  Orders Placed This Encounter  Procedures   Cantrils Ladder Assessment    No orders of the defined types were placed in this encounter.  I,Mykaella Javier,acting as a scribe for Skeet Latch, MD.,have documented all relevant documentation on the behalf of Skeet Latch, MD,as directed by  Skeet Latch, MD while in the presence of Skeet Latch, MD.  I, [place] Linsey, MD have reviewed all documentation for this visit.  The documentation of the  exam, diagnosis, procedures, and orders on 06/23/2021 are all accurate and complete.   Signed, Skeet Latch, MD  06/23/2021 12:10 PM    [facility]

## 2021-06-24 ENCOUNTER — Other Ambulatory Visit: Payer: Self-pay | Admitting: Cardiovascular Disease

## 2021-06-24 DIAGNOSIS — I10 Essential (primary) hypertension: Secondary | ICD-10-CM

## 2021-06-24 DIAGNOSIS — G4736 Sleep related hypoventilation in conditions classified elsewhere: Secondary | ICD-10-CM

## 2021-06-24 DIAGNOSIS — G4733 Obstructive sleep apnea (adult) (pediatric): Secondary | ICD-10-CM

## 2021-06-24 NOTE — Telephone Encounter (Signed)
Left message to return a call to discuss plans to proceed with her sleep apnea.

## 2021-06-29 NOTE — Progress Notes (Signed)
[facility] PREP Weekly Session  Patient Details  Name: Brittany Mcintyre MRN: [id number] Date of Birth: 10/15/1985 Age: 36 y.o. PCP: [facility]  There were no vitals filed for this visit.   [facility] Weekly seesion - 06/29/21 1200       [facility] "PREP" Location   [facility]PREP" Location [facility]      Weekly Session   Topic Discussed Stress management and problem solving   meditation   Minutes exercised this week 100 minutes    Classes attended to date 68           Late entry: class on 06/27/21  [name] 06/29/2021, 12:19 PM

## 2021-06-30 ENCOUNTER — Telehealth: Payer: Self-pay | Admitting: *Deleted

## 2021-06-30 NOTE — Telephone Encounter (Signed)
Patient returned a call to me and was given sleep study results and recommendations. She agrees to proceed with CPAP therapy. Order has been sent to [facility].

## 2021-06-30 NOTE — Telephone Encounter (Signed)
-----   Message from Thomas A Kelly, MD sent at 06/04/2021  3:44 PM EST ----- °Brittany Mcintyre, please notify pt and set up for CPAP titration study, if unable then Auto PAP °

## 2021-06-30 NOTE — Telephone Encounter (Signed)
Patient has not returned previous calls to discuss sleep study. My Chart message will be sent to return a call.

## 2021-07-12 NOTE — Progress Notes (Signed)
[facility] PREP Weekly Session  Patient Details  Name: Brittany Mcintyre MRN: [id number] Date of Birth: Jan 11, 1986 Age: 36 y.o. PCP: [facility]  There were no vitals filed for this visit.   [facility] Weekly seesion - 07/12/21 1700       [facility] "PREP" Location   [facility]PREP" Location [facility]      Weekly Session   Topic Discussed Other   Portion size, reading labels examples   Minutes exercised this week 90 minutes    Classes attended to date 15            Class on 07/11/21 at 230pm [name] 07/12/2021, 5:39 PM

## 2021-07-21 NOTE — Progress Notes (Signed)
[facility] PREP Weekly Session  Patient Details  Name: Brittany Mcintyre MRN: [id number] Date of Birth: [birth year] Age: 36 y.o. PCP: [facility]  Vitals:     [facility] Weekly seesion - 07/21/21 1700       [facility] "PREP" [facility]   [facility]      Weekly Session   Topic Discussed Finding support    Minutes exercised this week 100 minutes    Classes attended to date 76           Late entry. Class held on 07/18/21  [name] 07/21/2021, 5:17 PM

## 2021-07-26 NOTE — Progress Notes (Signed)
[facility] PREP Weekly Session  Patient Details  Name: Brittany Mcintyre MRN: [id number] Date of Birth: January 17, 1986 Age: 36 y.o. PCP: [facility]  There were no vitals filed for this visit.   [facility] Weekly seesion - 07/26/21 1700       [facility] "PREP" Location   [facility]      Weekly Session   Minutes exercised this week 110 minutes    Classes attended to date 20             [name] 07/26/2021, 5:39 PM

## 2021-07-28 ENCOUNTER — Encounter (HOSPITAL_BASED_OUTPATIENT_CLINIC_OR_DEPARTMENT_OTHER): Payer: BC Managed Care – PPO | Admitting: Cardiovascular Disease

## 2021-08-02 NOTE — Progress Notes (Signed)
[facility] PREP Weekly Session  Patient Details  Name: Brittany Mcintyre MRN: [id number] Date of Birth: [birth year] Age: 36 y.o. PCP: [facility]  There were no vitals filed for this visit.   [facility] Weekly seesion - 08/02/21 1700       [facility] "PREP" Location   [facility] "PREP" Location [facility]      Weekly Session   Topic Discussed Calorie breakdown;Hitting roadblocks   surveys and fit testing completed   Minutes exercised this week 100 minutes    Classes attended to date 23             [name] 08/02/2021, 5:26 PM

## 2021-08-17 NOTE — Progress Notes (Signed)
[facility] PREP Evaluation ? ?Patient Details  ?Name: Brittany Mcintyre ?MRN: [id number] ?Date of Birth: 18-Jun-1985 ?Age: 36 y.o. ?PCP: [facility] ? ?Vitals:  ? 08/03/21 1530  ?BP: 114/78  ?Pulse: 81  ?Weight: (!) 326 lb 6.4 oz (148.1 kg)  ? ? ? [facility] Eval - 08/17/21 1500   ? ?  ? [facility]  ? [facility] [place]   ?  ? Referral   ? Program Start Date 08/03/21   last class date of PREP  ?  ? Measurement  ? Waist Circumference 54 inches   ? Hip Circumference 55 inches   ? Body fat 48 percent   ?  ? Mobility and Daily Activities  ? I find it easy to walk up or down two or more flights of stairs. 3   ? I have no trouble taking out the trash. 4   ? I do housework such as vacuuming and dusting on my own without difficulty. 4   ? I can easily lift a gallon of milk (8lbs). 4   ? I can easily walk a mile. 3   ? I have no trouble reaching into high cupboards or reaching down to pick up something from the floor. 3   ? I do not have trouble doing out-door work such as Armed forces logistics/support/administrative officer, raking leaves, or gardening. 2   ?  ? Mobility and Daily Activities  ? I feel younger than my age. 4   ? I feel independent. 3   ? I feel energetic. 3   ? I live an active life.  3   ? I feel strong. 3   ? I feel healthy. 3   ? I feel active as other people my age. 3   ?  ? How fit and strong are you.  ? Fit and Strong Total Score 45   ? ?  ?  ? ?  ? ?Past Medical History:  ?Diagnosis Date  ? Asthma   ? Essential hypertension 02/08/2021  ? Morbid obesity ([facility]) 02/08/2021  ? OSA (obstructive sleep apnea) 06/23/2021  ? ?No past surgical history on file. ?Social History  ? ?Tobacco Use  ?Smoking Status Never  ?Smokeless Tobacco Never  ?Late entry from 08/03/21 ? ?Barnett Hatter ?08/17/2021, 3:58 PM ? ? ?

## 2021-08-18 DIAGNOSIS — E049 Nontoxic goiter, unspecified: Secondary | ICD-10-CM | POA: Diagnosis not present

## 2021-08-18 DIAGNOSIS — Z01419 Encounter for gynecological examination (general) (routine) without abnormal findings: Secondary | ICD-10-CM | POA: Diagnosis not present

## 2021-08-18 DIAGNOSIS — E282 Polycystic ovarian syndrome: Secondary | ICD-10-CM | POA: Diagnosis not present

## 2021-08-18 DIAGNOSIS — I10 Essential (primary) hypertension: Secondary | ICD-10-CM | POA: Diagnosis not present

## 2021-08-30 ENCOUNTER — Encounter (HOSPITAL_BASED_OUTPATIENT_CLINIC_OR_DEPARTMENT_OTHER): Payer: Self-pay | Admitting: Cardiovascular Disease

## 2021-08-30 MED ORDER — VALSARTAN-HYDROCHLOROTHIAZIDE 320-25 MG PO TABS: 1.0000 | ORAL_TABLET | Freq: Every day | ORAL | 3 refills | Status: DC

## 2021-09-27 DIAGNOSIS — H5210 Myopia, unspecified eye: Secondary | ICD-10-CM | POA: Diagnosis not present

## 2021-11-09 ENCOUNTER — Other Ambulatory Visit: Payer: Self-pay | Admitting: Cardiovascular Disease

## 2021-11-10 MED ORDER — AMLODIPINE BESYLATE 5 MG PO TABS: 5.0000 mg | ORAL_TABLET | Freq: Every day | ORAL | 3 refills | Status: DC

## 2021-11-21 IMAGING — US US THYROID
1 series · 13 of 25 positions shown · non-contrast
Comparison: None.

CLINICAL DATA: 34-year-old female with enlarged thyroid

EXAM:
THYROID ULTRASOUND
TECHNIQUE: Ultrasound examination of the thyroid gland and adjacent soft
tissues was performed.

[Series 1: us thyroid · 0.07mm/px · 13 of 47 slices shown]
[im 1/47]
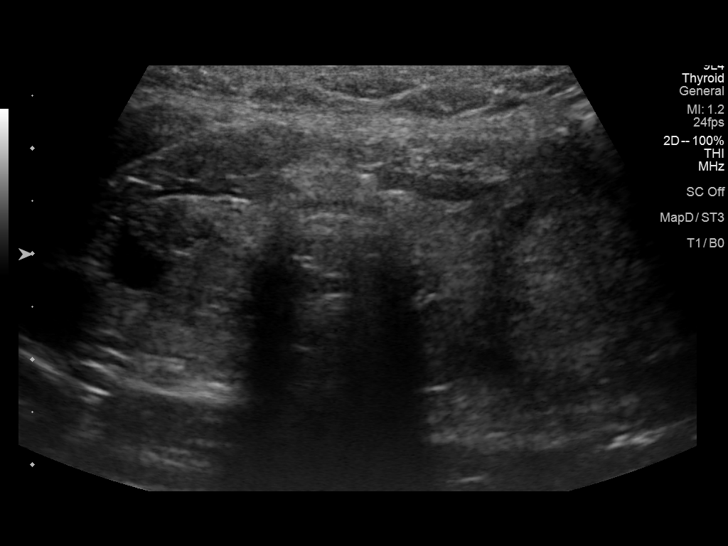
[im 4/47]
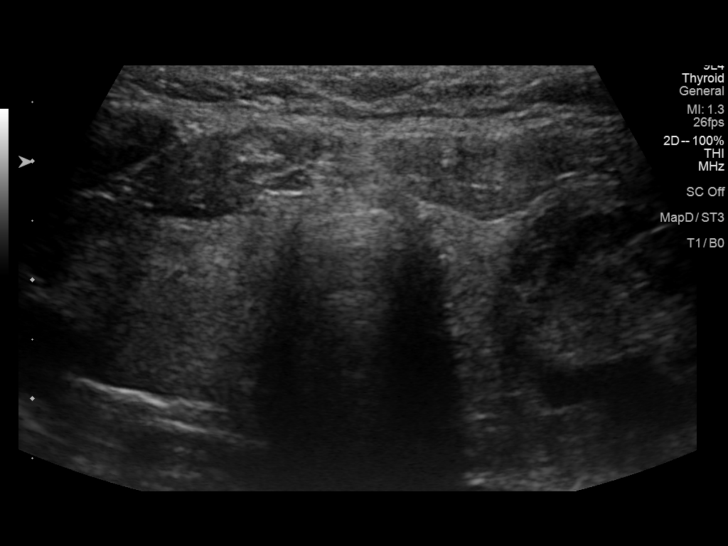
[im 8/47]
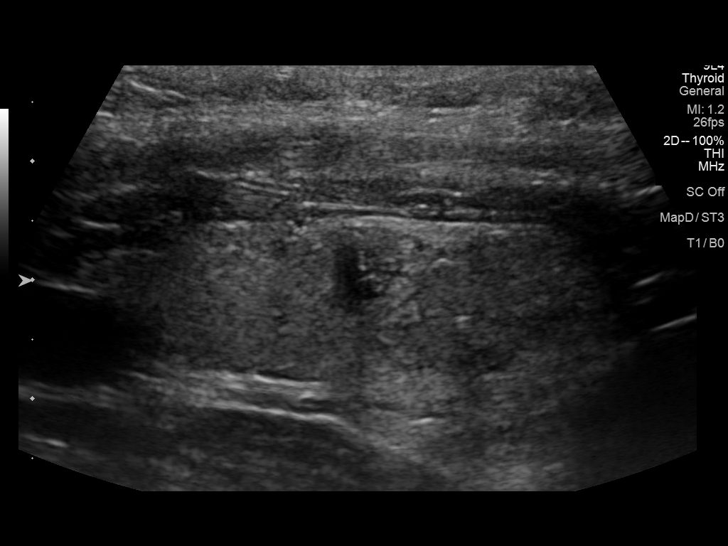
[im 12/47]
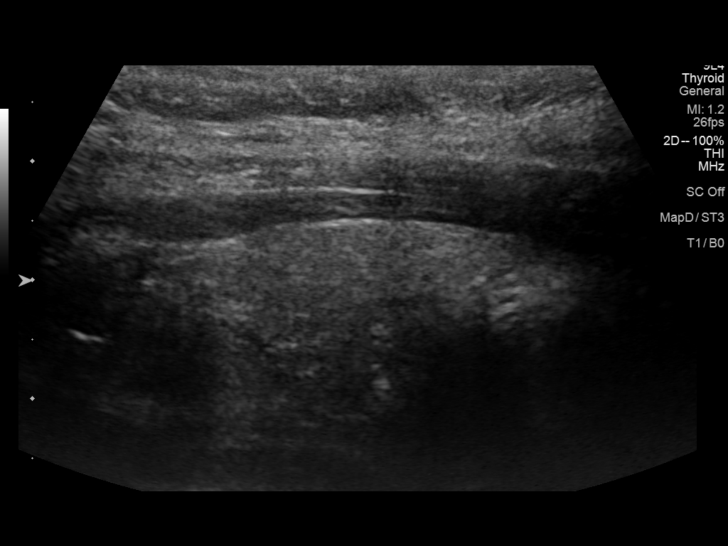
[im 16/47]
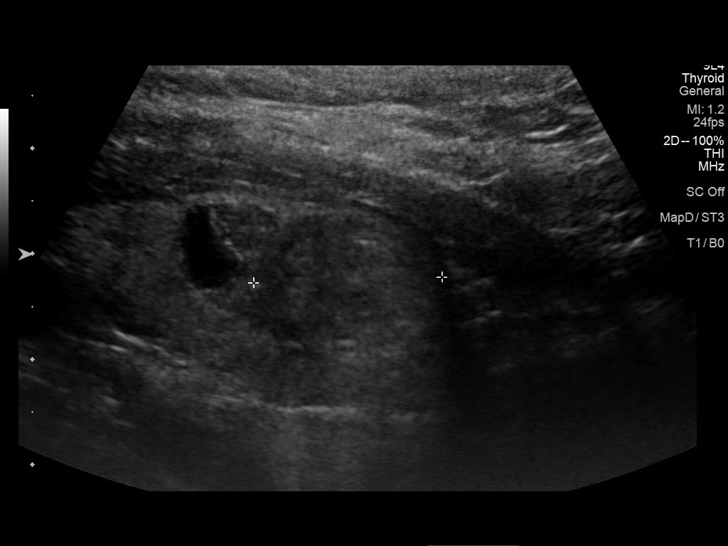
[im 20/47]
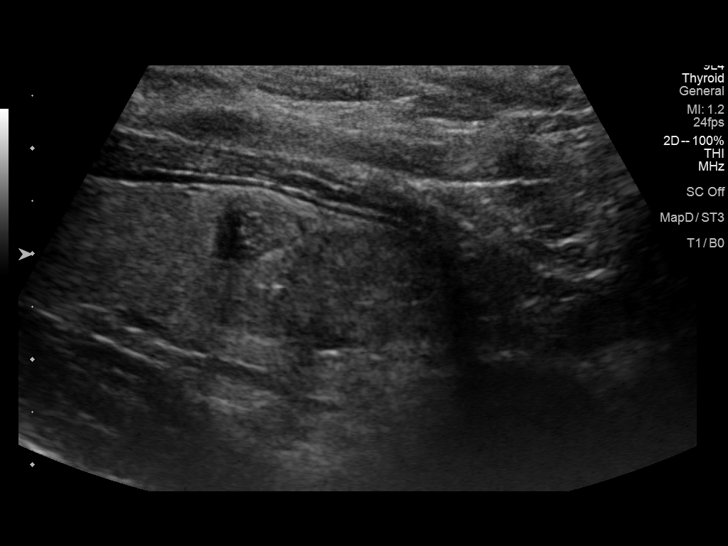
[im 24/47]
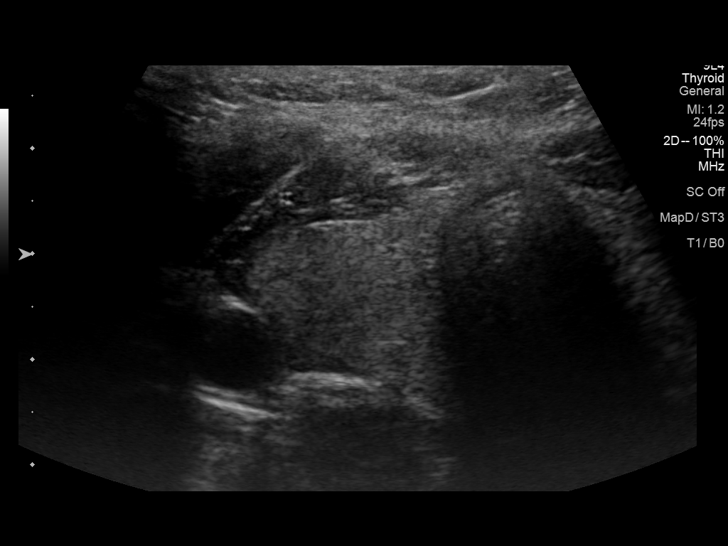
[im 27/47]
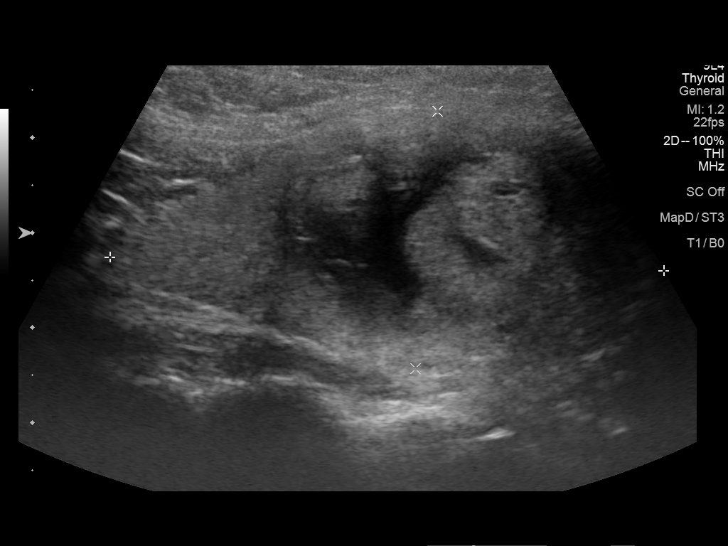
[im 31/47]
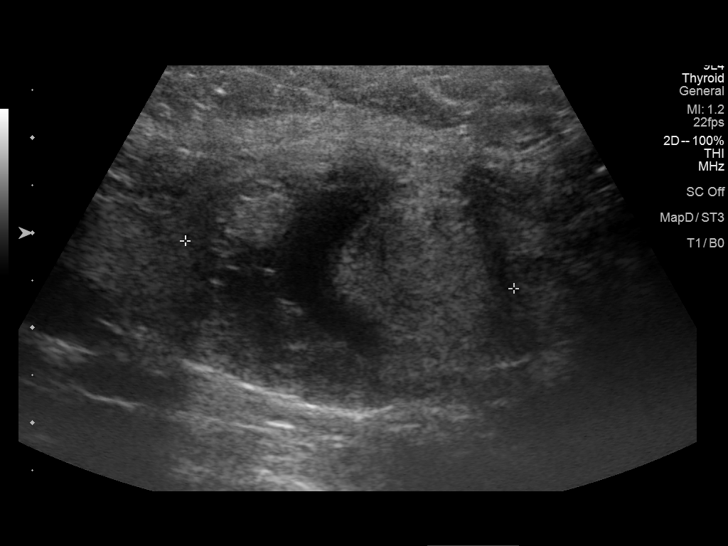
[im 35/47]
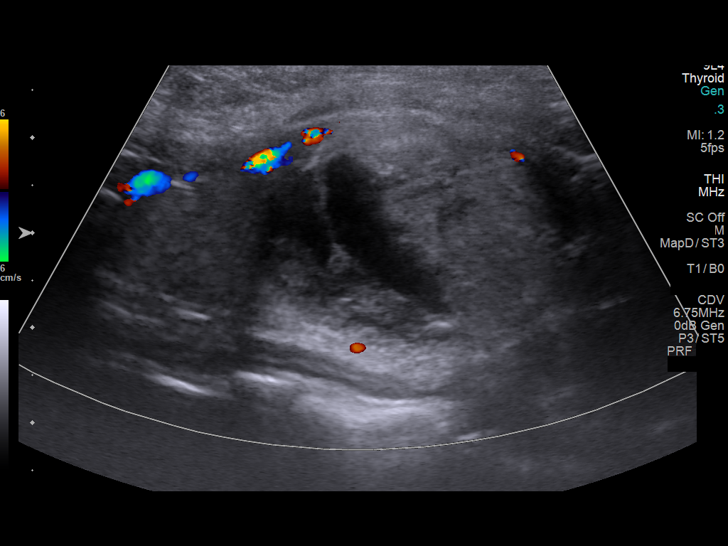
[im 39/47]
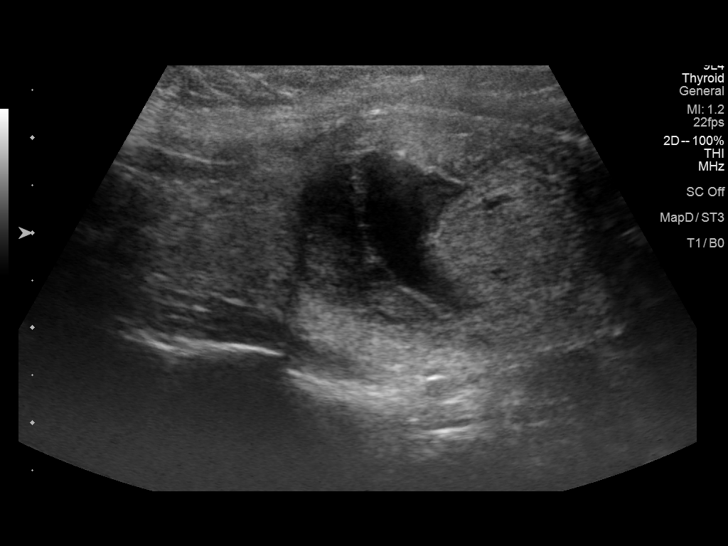
[im 43/47]
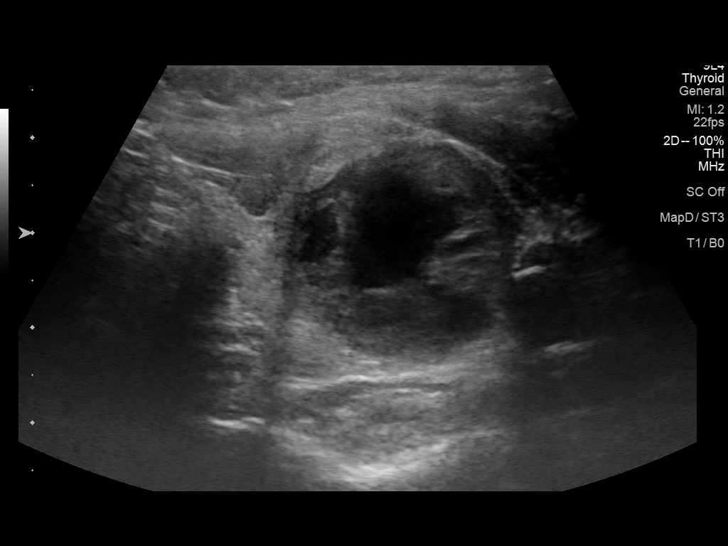
[im 47/47]
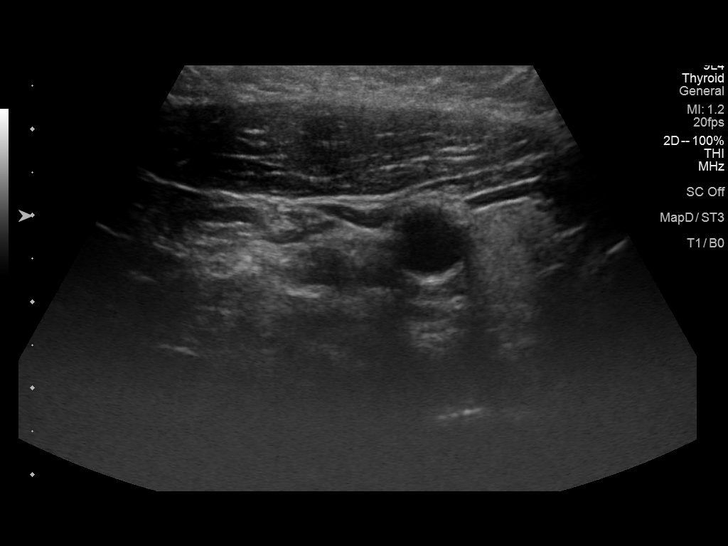

[13 of 25 positions shown; findings below may reference images not displayed]

FINDINGS: Parenchymal Echotexture: Mildly heterogenous

Isthmus: 0.3 cm

Right lobe: 5.3 cm x 2.3 cm x 1.8 cm

Left lobe: 5.8 cm x 2.7 cm x 3.2 cm

_________________________________________________________

Estimated total number of nodules >/= 1 cm: 3

Number of spongiform nodules >/=  2 cm not described below (TR1): 0

Number of mixed cystic and solid nodules >/= 1.5 cm not described
below (TR2): 0

_________________________________________________________

Nodule # 1:

Location: Right; Mid

Maximum size: 1.0 cm; Other 2 dimensions: 0.9 cm x 0.9 cm

Composition: mixed cystic and solid (1)

Echogenicity: hyperechoic (1)

Shape: not taller-than-wide (0)

Margins: ill-defined (0)

Echogenic foci: none (0)

ACR TI-RADS total points: 2.

ACR TI-RADS risk category: TR2 (2 points).

ACR TI-RADS recommendations:

Nodule does not meet criteria for surveillance or biopsy

_________________________________________________________

Nodule # 2:

Location: Right; Inferior

Maximum size: 1.8 cm; Other 2 dimensions: 1.7 cm x 1.3 cm

Composition: solid/almost completely solid (2)

Echogenicity: isoechoic (1)

Shape: not taller-than-wide (0)

Margins: ill-defined (0)

Echogenic foci: none (0)

ACR TI-RADS total points: 3.

ACR TI-RADS risk category: TR3 (3 points).

ACR TI-RADS recommendations:

Nodule meets criteria for surveillance

_________________________________________________________

Nodule # 3:

Location: Left; Mid

Maximum size: 3.5 cm; Other 2 dimensions: 2.8 cm x 2.3 cm

Composition: mixed cystic and solid (1)

Echogenicity: hypoechoic (2)

Shape: not taller-than-wide (0)

Margins: ill-defined (0)

Echogenic foci: none (0)

ACR TI-RADS total points: 3.

ACR TI-RADS risk category: TR3 (3 points).

ACR TI-RADS recommendations:

Nodule meets criteria for biopsy

_________________________________________________________

No adenopathy
IMPRESSION: Multinodular thyroid.

Left mid thyroid nodule (labeled 3, 3.5 cm, TR 3) meets criteria for
biopsy, as designated by the newly established ACR TI-RADS criteria,
and referral for biopsy is recommended.

Right lower thyroid nodule (labeled 2, 1.8 cm, TR 3) meets criteria
for surveillance, as designated by the newly established ACR TI-RADS
criteria. Surveillance ultrasound study recommended to be performed
annually up to 5 years.

Recommendations follow those established by the new ACR TI-RADS
criteria ([HOSPITAL] 7853;[DATE]).

## 2021-11-28 ENCOUNTER — Ambulatory Visit (HOSPITAL_BASED_OUTPATIENT_CLINIC_OR_DEPARTMENT_OTHER): Payer: BC Managed Care – PPO | Admitting: General Practice

## 2021-12-11 DIAGNOSIS — R7303 Prediabetes: Secondary | ICD-10-CM | POA: Diagnosis not present

## 2021-12-16 ENCOUNTER — Ambulatory Visit (HOSPITAL_BASED_OUTPATIENT_CLINIC_OR_DEPARTMENT_OTHER): Payer: BC Managed Care – PPO | Admitting: Cardiovascular Disease

## 2021-12-19 DIAGNOSIS — R7303 Prediabetes: Secondary | ICD-10-CM | POA: Diagnosis not present

## 2022-01-02 ENCOUNTER — Encounter (HOSPITAL_BASED_OUTPATIENT_CLINIC_OR_DEPARTMENT_OTHER): Payer: Self-pay | Admitting: Cardiovascular Disease

## 2022-01-02 ENCOUNTER — Ambulatory Visit (INDEPENDENT_AMBULATORY_CARE_PROVIDER_SITE_OTHER): Payer: BC Managed Care – PPO | Admitting: Cardiovascular Disease

## 2022-01-02 VITALS — BP 133/89 | HR 85 | Ht 67.0 in | Wt 331.9 lb

## 2022-01-02 DIAGNOSIS — I10 Essential (primary) hypertension: Secondary | ICD-10-CM

## 2022-01-02 LAB — HEMOGLOBIN A1C
Est. average glucose Bld gHb Est-mCnc: 120 mg/dL
Hgb A1c MFr Bld: 5.8 % — ABNORMAL HIGH (ref 4.8–5.6)

## 2022-01-02 MED ORDER — AMLODIPINE BESYLATE 10 MG PO TABS: 10.0000 mg | ORAL_TABLET | Freq: Every day | ORAL | 3 refills | Status: DC

## 2022-01-02 NOTE — Progress Notes (Signed)
Advanced Hypertension Clinic Follow-up:    Date:  01/02/2022   ID:  Brittany Mcintyre, DOB 12-02-1985, MRN [id number]  PCP:  [facility]  Cardiologist:  None  Nephrologist:  Referring MD: [facility] *   CC: Hypertension  History of Present Illness:    Brittany Mcintyre is a 36 y.o. female with a hx of hypertension, and OSA, here for follow-up.  She noted that she was first diagnosed with hypertension in her teens.  She did not recall having any work-up for why she developed hypertension at such a young age.  She started taking medications in 2019.  She noted that she is interested in potentially having a baby sometime in the next several years but does not yet have a partner.  At her first visit, her blood pressure was 131/94. She was enrolled in the PREP program at the [facility]. She had renal artery dopplers 03/2021 that were normal. She was also referred for a sleep study. We discussed switching off of an ARB if she did decide to become pregnant. Given she did not have a partner at the time, she was switched from losartan to valsartan.   At her last appointment she was working on getting a CPAP machine. Her blood pressures were well controlled. Today, she reports that her blood pressure at home has been similar to today's in clinic reading of 133/89. She continues to have LE edema, without noticeable improvement since her last appointment. Currently she is a full time PHD student. For activity, she has been assisting with a summer camp. She makes a 10 minute walk to the cafeteria and back every day. Initially this was difficult, but she has noticed some improvement in her exercise tolerance. Her weight has been stable at home, but she notices other changes in her body that indicate weight loss. Regarding her diet, she states that some days are better than others. Sometimes she is not eating as regularly; there may be long periods of time between meals. She  denies any palpitations, chest pain, shortness of breath. No lightheadedness, headaches, syncope, orthopnea, or PND.  Previous antihypertensives: Losartan/HCTZ   Past Medical History:  Diagnosis Date   Asthma    Essential hypertension 02/08/2021   Morbid obesity (HCC) 02/08/2021   OSA (obstructive sleep apnea) 06/23/2021    History reviewed. No pertinent surgical history.  Current Medications: Current Meds  Medication Sig   cetirizine (ZYRTEC) 10 MG tablet Take 10 mg by mouth daily as needed for allergies.   Multiple Vitamins-Minerals (MULTIVITAMIN ADULT PO) Take 1 tablet by mouth daily.   valsartan-hydrochlorothiazide (DIOVAN-HCT) 320-25 MG tablet Take 1 tablet by mouth daily.   [DISCONTINUED] amLODipine (NORVASC) 5 MG tablet Take 1 tablet (5 mg total) by mouth daily.     Allergies:   Shellfish allergy   Social History   Socioeconomic History   Marital status: Single    Spouse name: Not on file   Number of children: Not on file   Years of education: Not on file   Highest education level: Not on file  Occupational History   Not on file  Tobacco Use   Smoking status: Never   Smokeless tobacco: Never  Substance and Sexual Activity   Alcohol use: No   Drug use: Not on file   Sexual activity: Not on file  Other Topics Concern   Not on file  Social History Narrative   Not on file   Social Determinants of Health  Financial Resource Strain: Low Risk  (02/08/2021)   Overall Financial Resource Strain (CARDIA)    Difficulty of Paying Living Expenses: Not hard at all  Food Insecurity: No Food Insecurity (02/08/2021)   Hunger Vital Sign    Worried About Running Out of Food in the Last Year: Never true    Ran Out of Food in the Last Year: Never true  Transportation Needs: No Transportation Needs (02/08/2021)   PRAPARE - Administrator, Civil Service (Medical): No    Lack of Transportation (Non-Medical): No  Physical Activity: Inactive (02/08/2021)   Exercise Vital  Sign    Days of Exercise per Week: 0 days    Minutes of Exercise per Session: 0 min  Stress: Not on file  Social Connections: Not on file     Family History: The patient's family history includes Diabetes in her mother; Heart attack in her maternal grandmother and mother; Hypertension in her father, maternal grandfather, maternal grandmother, mother, and paternal grandmother; Stroke in her paternal grandmother.  ROS:   Please see the history of present illness.    (+) Bilateral LE edema All other systems reviewed and are negative.  EKGs/Labs/Other Studies Reviewed:    Renal Artery Doppler 03/15/21 Largest Aortic Diameter: 2.4 cm  Renal:     Right: Normal size right kidney. Normal right Resisitive Index.         Normal cortical thickness of right kidney. No evidence of         right renal artery stenosis. RRV flow present.  Left:  Normal size of left kidney. Normal left Resistive Index.         Normal cortical thickness of the left kidney. No evidence of         left renal artery stenosis. LRV flow present.  Mesenteric:  Normal Celiac artery and Superior Mesenteric artery findings.   EKG: EKG was personally reviewed. 01/02/2022:  Sinus rhythm. Rate 85 bpm. Left axis deviation. Low voltage. 02/08/21: EKG is sinus rhythm.  Rate 91 bpm.  Low voltage.    Recent Labs: 03/14/2021: BUN 9; Creatinine, Ser 0.82; Potassium 3.6; Sodium 138   Recent Lipid Panel No results found for: "CHOL", "TRIG", "HDL", "CHOLHDL", "VLDL", "LDLCALC", "LDLDIRECT"  Physical Exam:    VS:  BP 133/89 (BP Location: Left Arm, Patient Position: Sitting, Cuff Size: Normal)   Pulse 85   Ht 5\' 7"  (1.702 m)   Wt (!) 331 lb 14.4 oz (150.5 kg)   BMI 51.98 kg/m  , BMI Body mass index is 51.98 kg/m. GENERAL:  Well appearing HEENT: Pupils equal round and reactive, fundi not visualized, oral mucosa unremarkable NECK:  No jugular venous distention, waveform within normal limits, carotid upstroke brisk and symmetric,  no bruits LUNGS:  Clear to auscultation bilaterally HEART:  RRR.  PMI not displaced or sustained,S1 and S2 within normal limits, no S3, no S4, no clicks, no rubs, no murmurs ABD:  Flat, positive bowel sounds normal in frequency in pitch, no bruits, no rebound, no guarding, no midline pulsatile mass, no hepatomegaly, no splenomegaly EXT:  2 plus pulses throughout, no edema, no cyanosis no clubbing SKIN:  No rashes no nodules NEURO:  Cranial nerves II through XII grossly intact, motor grossly intact throughout PSYCH:  Cognitively intact, oriented to person place and time   ASSESSMENT/PLAN:    Essential hypertension Blood pressure is above her goal of <130/80. She is going to keep exercising after her camp ends.  Plan for at least 150 minutes  weekly.  Increase amlodipine.  Continue valsartan/HCTZ.    Morbid obesity (HCC) She was encouraged to keep exercising as above.  Recommend checking an A1c and considering whether she is a candidate for GLP-1 agonists.      Screening for Secondary Hypertension:     02/08/2021    4:03 PM  Causes  Drugs/Herbals Screened     - Comments limited caffeine.  Renovascular HTN Screened     - Comments check renal Dopplers  Sleep Apnea Screened     - Comments will get sleep study  Thyroid Disease Screened  Hyperaldosteronism N/A  Pheochromocytoma N/A  Cushing's Syndrome N/A  Hyperparathyroidism N/A  Coarctation of the Aorta Screened     - Comments BP symmetric  Compliance Screened     - Comments struggles but she is improving.    Relevant Labs/Studies:    Latest Ref Rng & Units 03/14/2021   11:17 AM  Basic Labs  Sodium 134 - 144 mmol/L 138   Potassium 3.5 - 5.2 mmol/L 3.6   Creatinine 0.57 - 1.00 mg/dL 1.66                    03/15/2021   10:09 AM  Renovascular   Renal Artery [place] Completed Yes     Disposition:  FU with Leora Platt C. Duke Salvia, MD, [facility] in 2-3 months.   Medication Adjustments/Labs and Tests Ordered: Current medicines  are reviewed at length with the patient today.  Concerns regarding medicines are outlined above.   Orders Placed This Encounter  Procedures   HgB A1c   EKG 12-Lead   Meds ordered this encounter  Medications   amLODipine (NORVASC) 10 MG tablet    Sig: Take 1 tablet (10 mg total) by mouth daily.    Dispense:  90 tablet    Refill:  3   I,Mathew Stumpf,acting as a scribe for Chilton Si, MD.,have documented all relevant documentation on the behalf of Chilton Si, MD,as directed by  Chilton Si, MD while in the presence of Chilton Si, MD.  I, Akua Blethen C. Duke Salvia, MD have reviewed all documentation for this visit.  The documentation of the exam, diagnosis, procedures, and orders on 01/02/2022 are all accurate and complete.  Signed, Chilton Si, MD  01/02/2022 11:35 AM    [facility]

## 2022-01-02 NOTE — Assessment & Plan Note (Signed)
She was encouraged to keep exercising as above.  Recommend checking an A1c and considering whether she is a candidate for GLP-1 agonists.

## 2022-01-02 NOTE — Assessment & Plan Note (Signed)
Blood pressure is above her goal of <130/80. She is going to keep exercising after her camp ends.  Plan for at least 150 minutes weekly.  Increase amlodipine.  Continue valsartan/HCTZ.

## 2022-01-02 NOTE — Patient Instructions (Signed)
Medication Instructions:  INCREASE YOUR AMLODIPINE TO 10 MG DAILY   Labwork: A1C TODAY   Testing/Procedures: NONE  Follow-Up: 02/20/2022 AT 11:00 AM WITH DR [place]   Any Other Special Instructions Will Be Listed Below (If Applicable). MONITOR YOUR BLOOD PRESSURE AND BRING YOUR READINGS TO YOUR FOLLOW UP APPOINTMENT

## 2022-01-17 DIAGNOSIS — R7303 Prediabetes: Secondary | ICD-10-CM | POA: Diagnosis not present

## 2022-01-23 IMAGING — US US FNA BIOPSY THYROID 1ST LESION
1 series · 13 of 20 positions shown · non-contrast
Comparison: Thyroid ultrasound dated 09/02/2020

INDICATION: Patient with history of thyroid goiter and thyroid ultrasound on
09/02/2020 which revealed a 3.5 cm left mid nodule which meets
criteria for biopsy. She presents today for the procedure.

EXAM:
ULTRASOUND GUIDED FINE NEEDLE ASPIRATION BIOPSY OF LEFT MID THYROID
NODULE
TECHNIQUE: Informed written consent was obtained from the patient after a
discussion of the risks, benefits and alternatives to treatment.
Questions regarding the procedure were encouraged and answered. A
timeout was performed prior to the initiation of the procedure.

[Series 1: us fna biopsy thyroid 1st lesion · 0.06mm/px · 20 acquisitions, 13 frames shown]
[im 1/20]
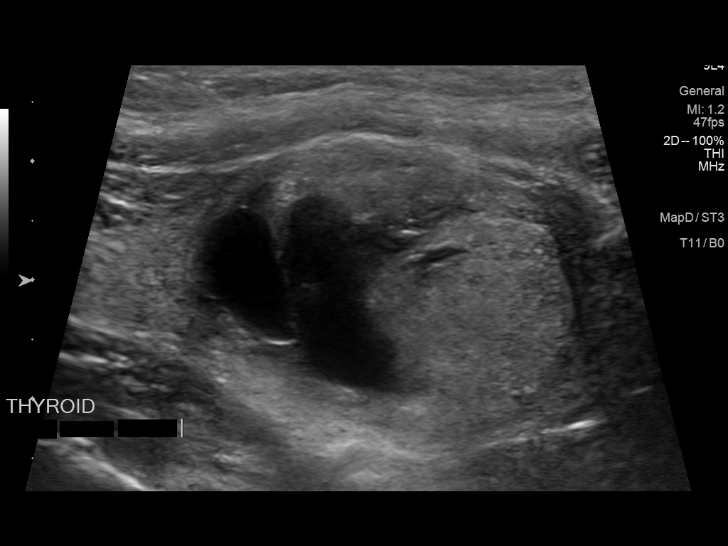
[im 3/20]
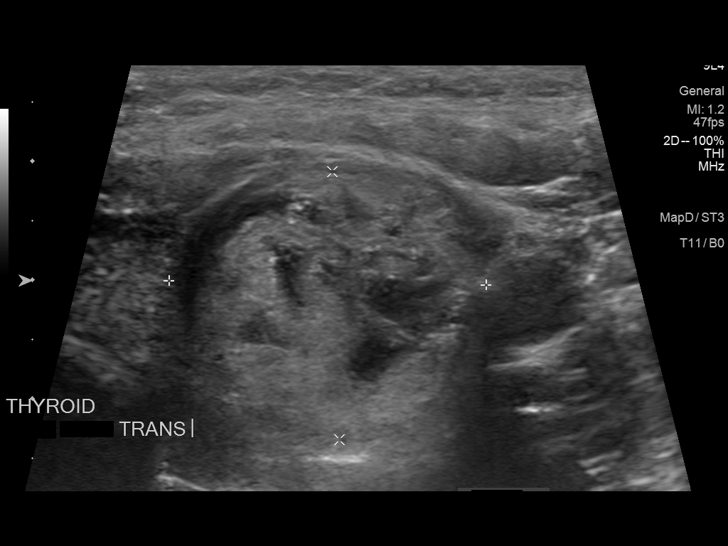
[im 4/20]
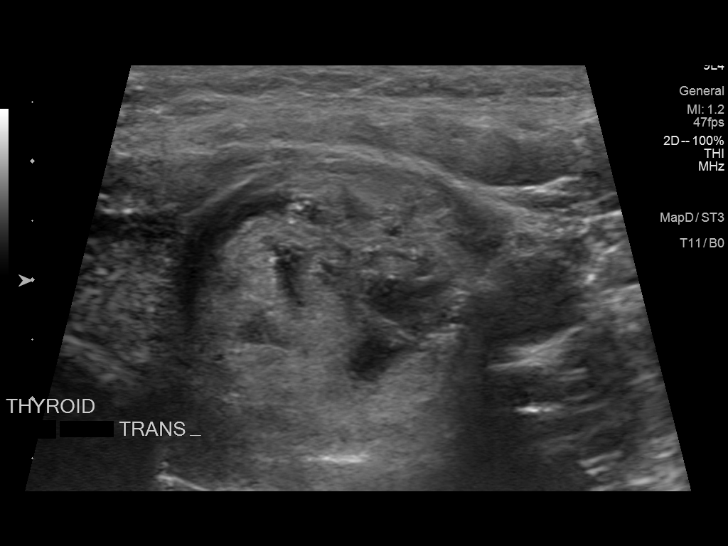
[im 6/20]
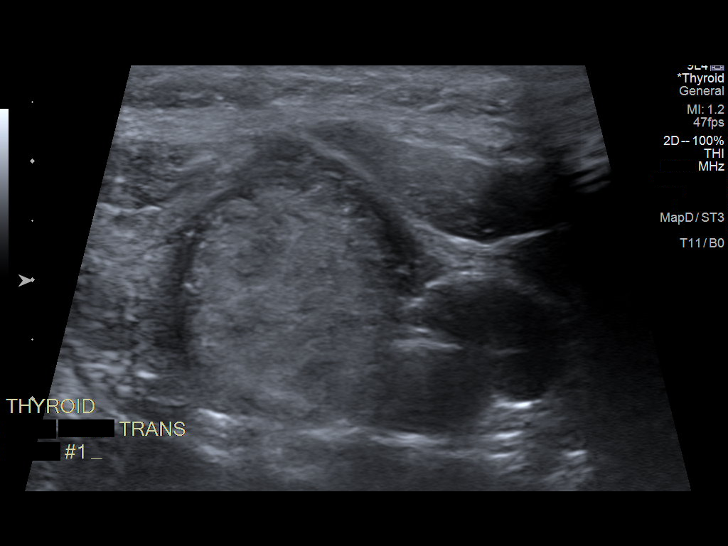
[im 7/20]
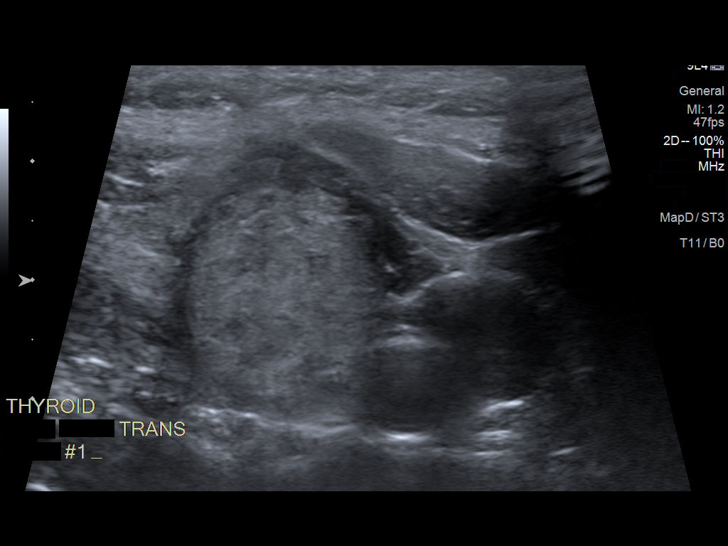
[im 9/20]
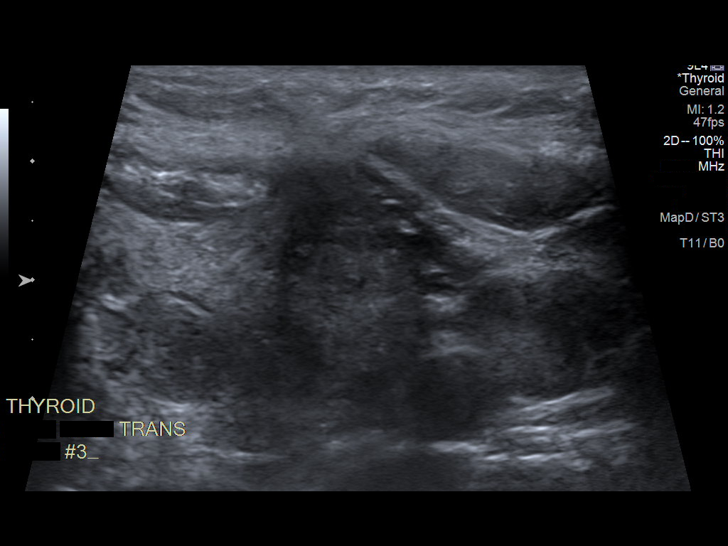
[im 11/20]
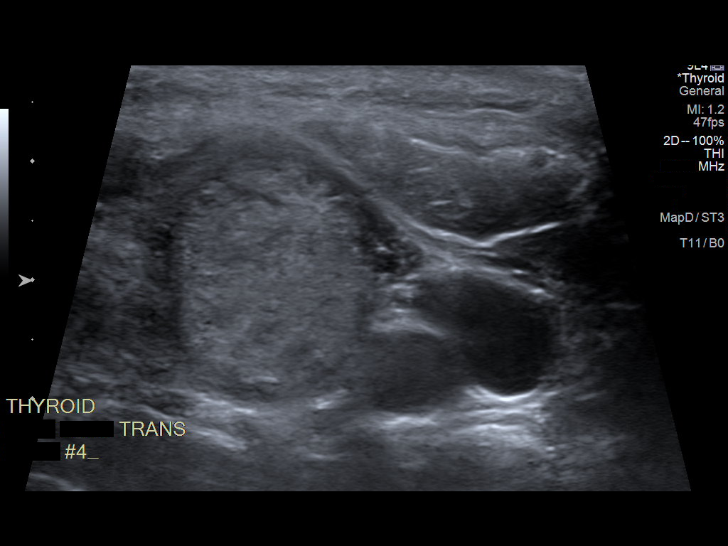
[im 12/20]
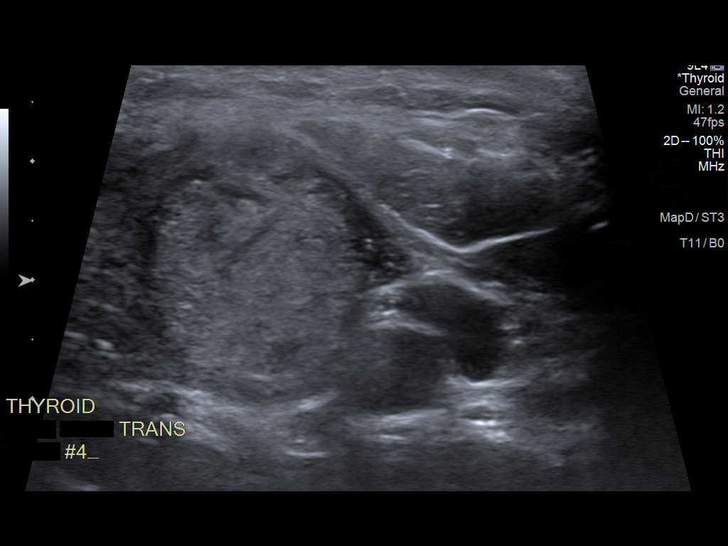
[im 14/20]
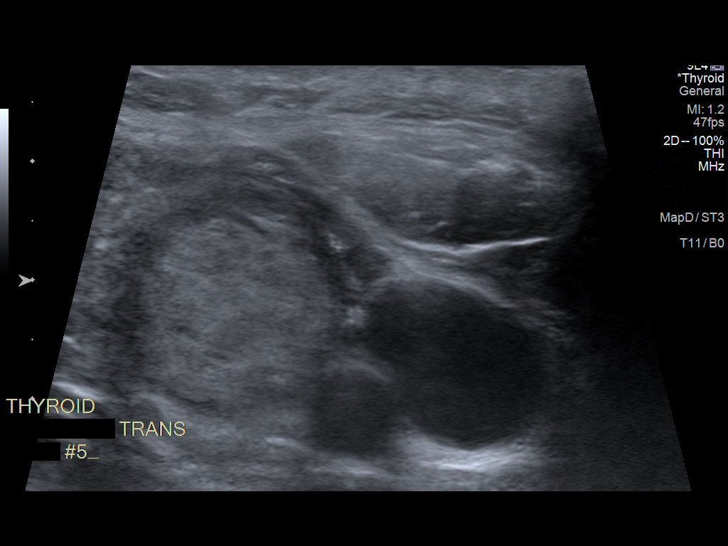
[im 15/20]
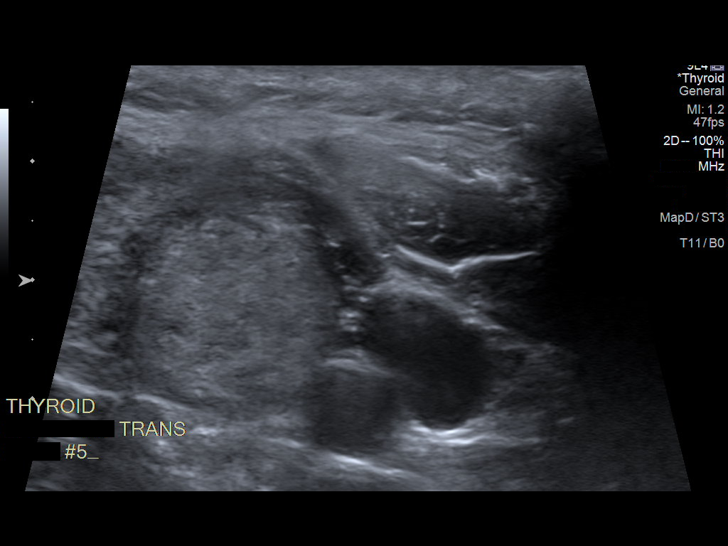
[im 17/20]
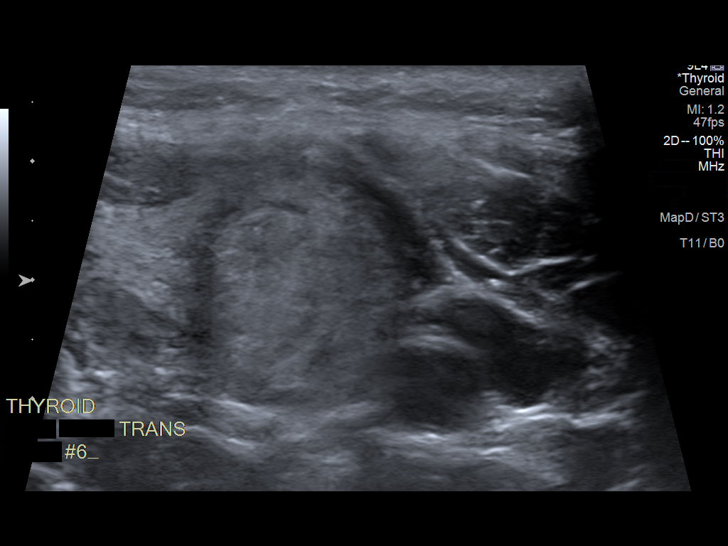
[im 18/20]
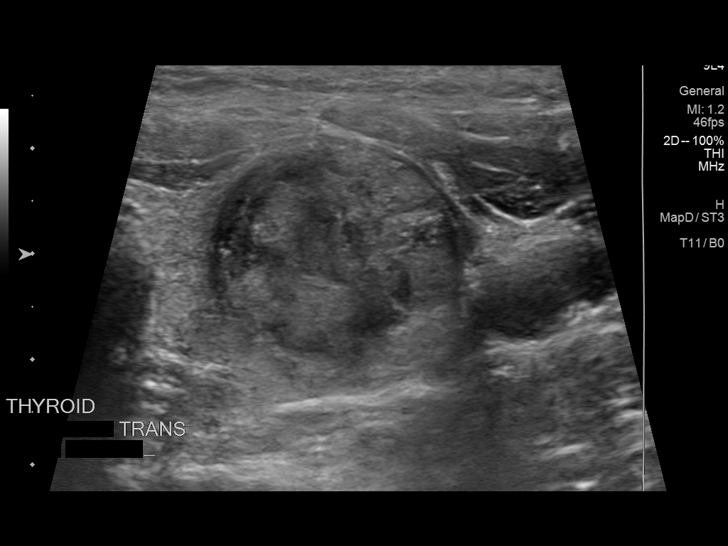
[im 20/20]
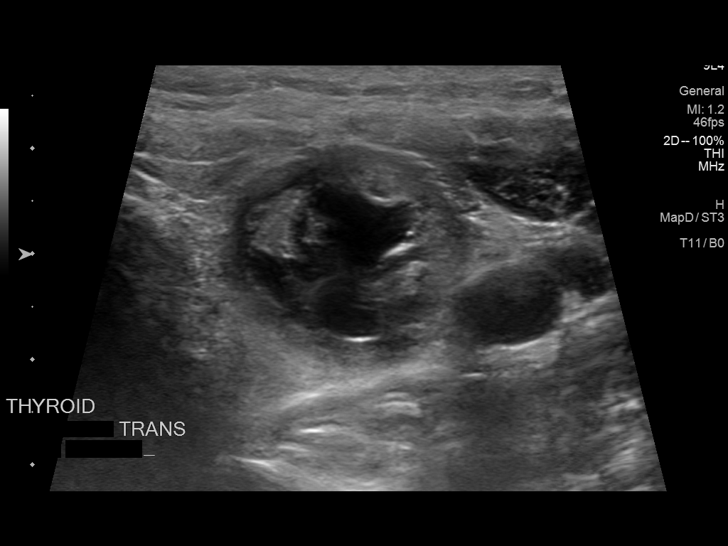

[13 of 20 positions shown; findings below may reference images not displayed]

MEDICATIONS:
1% lidocaine to skin and subcutaneous tissue

COMPLICATIONS:
None immediate.
Pre-procedural ultrasound scanning demonstrated unchanged size and
appearance of the indeterminate nodule within the left mid thyroid
lobe

The procedure was planned. The neck was prepped in the usual sterile
fashion, and a sterile drape was applied covering the operative
field. A timeout was performed prior to the initiation of the
procedure. Local anesthesia was provided with 1% lidocaine.

Under direct ultrasound guidance, 6 FNA biopsies were performed of
the left mid thyroid nodule with 27 gauge needles. Multiple
ultrasound images were saved for procedural documentation purposes.
The samples were prepared and submitted to pathology as well as for
Afirma testing.

Limited post procedural scanning was negative for hematoma or
additional complication. Dressings were placed. The patient
tolerated the above procedures procedure well without immediate
postprocedural complication.
FINDINGS: Nodule reference number based on prior diagnostic ultrasound: 3

Maximum size: 3.5 cm

Location: Left; Mid

ACR TI-RADS risk category: TR3 (3 points)

Reason for biopsy: meets ACR TI-RADS criteria

Ultrasound imaging confirms appropriate placement of the needles
within the thyroid nodule.
IMPRESSION: Technically successful ultrasound guided fine needle aspiration
biopsy of left mid thyroid nodule. Final pathology pending.

## 2022-01-27 DIAGNOSIS — R109 Unspecified abdominal pain: Secondary | ICD-10-CM | POA: Diagnosis not present

## 2022-01-27 DIAGNOSIS — S39011S Strain of muscle, fascia and tendon of abdomen, sequela: Secondary | ICD-10-CM | POA: Diagnosis not present

## 2022-02-17 NOTE — Progress Notes (Signed)
Advanced Hypertension Clinic Follow-up:    Date:  02/20/2022   ID:  Brittany Mcintyre, DOB [birth year], MRN [id number]  PCP:  [facility]  Cardiologist:  None  Nephrologist:  Referring MD: [facility] *   CC: Hypertension  History of Present Illness:    Brittany Mcintyre is a 36 y.o. female with a hx of hypertension, and OSA, here for follow-up.  She noted that she was first diagnosed with hypertension in her teens.  She did not recall having any work-up for why she developed hypertension at such a young age.  She started taking medications in 2019.  She noted that she is interested in potentially having a baby sometime in the next several years but does not yet have a partner. At her initial visit, her blood pressure was 131/94. She was enrolled in the PREP program at the [facility]. She had renal artery dopplers 03/2021 that were normal. She was also referred for a sleep study. We discussed switching off of an ARB if she did decide to become pregnant. Given she did not have a partner at the time, she was switched from losartan to valsartan.   At her last visit she reported blood pressures similar to 133/89 on average. Her amlodipine was increased. She also complained of LE edema with no significant change in the interim. She was encouraged to keep exercising. Today, she reports successfully losing weight. She has been walking more frequently. At first this was difficult, but she has noticed improved exercise tolerance. She is also cooking more meals at home with monitoring her salt intake. In clinic today her blood pressure is 116/83 which she notes is typical of her readings at home. She occasionally notices blood pressures a little over 120/80. Of note she has not yet received her CPAP; she has not been contacted recently. Additionally she complains of a non-healing wound on her chest that initially began as a blister. She denies fevers, chills, erythema, or  associated pain. She denies any palpitations, chest pain, shortness of breath, or peripheral edema. No lightheadedness, headaches, syncope, orthopnea, or PND.  Previous antihypertensives: Losartan/HCTZ   Past Medical History:  Diagnosis Date   Asthma    Essential hypertension 02/08/2021   Morbid obesity (HCC) 02/08/2021   OSA (obstructive sleep apnea) 06/23/2021    No past surgical history on file.  Current Medications: Current Meds  Medication Sig   amLODipine (NORVASC) 10 MG tablet Take 1 tablet (10 mg total) by mouth daily.   cetirizine (ZYRTEC) 10 MG tablet Take 10 mg by mouth daily as needed for allergies.   Multiple Vitamins-Minerals (MULTIVITAMIN ADULT PO) Take 1 tablet by mouth daily.   valsartan-hydrochlorothiazide (DIOVAN-HCT) 320-25 MG tablet Take 1 tablet by mouth daily.     Allergies:   Shellfish allergy   Social History   Socioeconomic History   Marital status: Single    Spouse name: Not on file   Number of children: Not on file   Years of education: Not on file   Highest education level: Not on file  Occupational History   Not on file  Tobacco Use   Smoking status: Never   Smokeless tobacco: Never  Substance and Sexual Activity   Alcohol use: No   Drug use: Not on file   Sexual activity: Not on file  Other Topics Concern   Not on file  Social History Narrative   Not on file   Social Determinants of Health   Financial Resource Strain:  Low Risk  (02/08/2021)   Overall Financial Resource Strain (CARDIA)    Difficulty of Paying Living Expenses: Not hard at all  Food Insecurity: No Food Insecurity (02/08/2021)   Hunger Vital Sign    Worried About Running Out of Food in the Last Year: Never true    Ran Out of Food in the Last Year: Never true  Transportation Needs: No Transportation Needs (02/08/2021)   PRAPARE - Administrator, Civil Service (Medical): No    Lack of Transportation (Non-Medical): No  Physical Activity: Inactive (02/08/2021)    Exercise Vital Sign    Days of Exercise per Week: 0 days    Minutes of Exercise per Session: 0 min  Stress: Not on file  Social Connections: Not on file     Family History: The patient's family history includes Diabetes in her mother; Heart attack in her maternal grandmother and mother; Hypertension in her father, maternal grandfather, maternal grandmother, mother, and paternal grandmother; Stroke in her paternal grandmother.  ROS:   Please see the history of present illness.    (+) Non-healing lesion of chest All other systems reviewed and are negative.  EKGs/Labs/Other Studies Reviewed:    Renal Artery Doppler 03/15/21 Largest Aortic Diameter: 2.4 cm  Renal:     Right: Normal size right kidney. Normal right Resisitive Index.         Normal cortical thickness of right kidney. No evidence of         right renal artery stenosis. RRV flow present.  Left:  Normal size of left kidney. Normal left Resistive Index.         Normal cortical thickness of the left kidney. No evidence of         left renal artery stenosis. LRV flow present.  Mesenteric:  Normal Celiac artery and Superior Mesenteric artery findings.   EKG: EKG was personally reviewed. 02/20/2022:  EKG was not ordered. 01/02/2022:  Sinus rhythm. Rate 85 bpm. Left axis deviation. Low voltage. 02/08/21: EKG is sinus rhythm.  Rate 91 bpm.  Low voltage.    Recent Labs: 03/14/2021: BUN 9; Creatinine, Ser 0.82; Potassium 3.6; Sodium 138   Recent Lipid Panel No results found for: "CHOL", "TRIG", "HDL", "CHOLHDL", "VLDL", "LDLCALC", "LDLDIRECT"  Physical Exam:    VS:  BP 116/83 (BP Location: Right Arm, Patient Position: Sitting, Cuff Size: Large)   Pulse 74   Ht 5\' 7"  (1.702 m)   Wt (!) 325 lb 12.8 oz (147.8 kg)   SpO2 97%   BMI 51.03 kg/m  , BMI Body mass index is 51.03 kg/m. GENERAL:  Well appearing HEENT: Pupils equal round and reactive, fundi not visualized, oral mucosa unremarkable NECK:  No jugular venous  distention, waveform within normal limits, carotid upstroke brisk and symmetric, no bruits LUNGS:  Clear to auscultation bilaterally HEART:  RRR.  PMI not displaced or sustained,S1 and S2 within normal limits, no S3, no S4, no clicks, no rubs, no murmurs ABD:  Flat, positive bowel sounds normal in frequency in pitch, no bruits, no rebound, no guarding, no midline pulsatile mass, no hepatomegaly, no splenomegaly EXT:  2 plus pulses throughout, no edema, no cyanosis no clubbing SKIN:  No rashes no nodules NEURO:  Cranial nerves II through XII grossly intact, motor grossly intact throughout PSYCH:  Cognitively intact, oriented to person place and time   ASSESSMENT/PLAN:    No problem-specific Assessment & Plan notes found for this encounter.    Screening for Secondary Hypertension:  02/08/2021    4:03 PM  Causes  Drugs/Herbals Screened     - Comments limited caffeine.  Renovascular HTN Screened     - Comments check renal Dopplers  Sleep Apnea Screened     - Comments will get sleep study  Thyroid Disease Screened  Hyperaldosteronism N/A  Pheochromocytoma N/A  Cushing's Syndrome N/A  Hyperparathyroidism N/A  Coarctation of the Aorta Screened     - Comments BP symmetric  Compliance Screened     - Comments struggles but she is improving.    Relevant Labs/Studies:    Latest Ref Rng & Units 03/14/2021   11:17 AM  Basic Labs  Sodium 134 - 144 mmol/L 138   Potassium 3.5 - 5.2 mmol/L 3.6   Creatinine 0.57 - 1.00 mg/dL 4.48                    03/15/2021   10:09 AM  Renovascular   Renal Artery [place] Completed Yes     Disposition:  FU with Joshua Zeringue C. Duke Salvia, MD, [facility] in 6 months.  Medication Adjustments/Labs and Tests Ordered: Current medicines are reviewed at length with the patient today.  Concerns regarding medicines are outlined above.   No orders of the defined types were placed in this encounter.  No orders of the defined types were placed in this  encounter.  I,Mathew Stumpf,acting as a Neurosurgeon for Chilton Si, MD.,have documented all relevant documentation on the behalf of Chilton Si, MD,as directed by  Chilton Si, MD while in the presence of Chilton Si, MD.  I, Guiselle Mian C. Duke Salvia, MD have reviewed all documentation for this visit.  The documentation of the exam, diagnosis, procedures, and orders on 02/20/2022 are all accurate and complete.  Guadalupe Maple  02/20/2022 11:37 AM    [facility]

## 2022-02-20 ENCOUNTER — Ambulatory Visit (HOSPITAL_BASED_OUTPATIENT_CLINIC_OR_DEPARTMENT_OTHER): Payer: BC Managed Care – PPO | Admitting: Cardiovascular Disease

## 2022-02-20 ENCOUNTER — Encounter (HOSPITAL_BASED_OUTPATIENT_CLINIC_OR_DEPARTMENT_OTHER): Payer: Self-pay | Admitting: Cardiovascular Disease

## 2022-02-20 DIAGNOSIS — G4733 Obstructive sleep apnea (adult) (pediatric): Secondary | ICD-10-CM | POA: Diagnosis not present

## 2022-02-20 DIAGNOSIS — I10 Essential (primary) hypertension: Secondary | ICD-10-CM

## 2022-02-20 MED ORDER — AMLODIPINE-VALSARTAN-HCTZ 10-320-25 MG PO TABS: 1.0000 | ORAL_TABLET | Freq: Every day | ORAL | 3 refills | Status: DC

## 2022-02-20 NOTE — Patient Instructions (Signed)
Medication Instructions:  STOP VALSARTAN HCT  STOP AMLODIPINE   START AMLODIPINE-VALSARTAN-HCT 10-320-25 MG DAILY   Labwork: NONE  Testing/Procedures: NON  Follow-Up 08/10/2022 10:15 WITH DR [facility]

## 2022-02-20 NOTE — Assessment & Plan Note (Signed)
She still hasn't heard back from the CPAP company.  We will try to help facilitate this.

## 2022-02-20 NOTE — Assessment & Plan Note (Signed)
Blood pressure is nearly controlled.  Her goal is less than 130/80.  She is doing a great job on working on diet and exercise and is losing weight.  We will continue her current regimen for now.  We will change this to combination of amlodipine/valsartan/HCTZ at the current doses.

## 2022-02-21 ENCOUNTER — Telehealth (HOSPITAL_BASED_OUTPATIENT_CLINIC_OR_DEPARTMENT_OTHER): Payer: Self-pay | Admitting: *Deleted

## 2022-02-21 NOTE — Telephone Encounter (Signed)
-----   Message from Lauralee Evener, [place] sent at 02/21/2022  2:42 PM EDT ----- Ivin Booty at [employer] just said to have the patient to contact them. [phone]. ----- Message ----- From: Earvin Hansen, LPN Sent: 8/00/3491   1:56 PM EDT To: Lauralee Evener, CMA  Can another order be put in?   ----- Message ----- From: Lauralee Evener, CMA Sent: 02/20/2022  12:30 PM EDT To: Skeet Latch, MD; Earvin Hansen, LPN  [employer] tried to reach this patient back in May and could not. Held a machine for 3 weeks and never heard from her so they cancelled the order and moved on.  ----- Message ----- From: Earvin Hansen, LPN Sent: 7/91/5056  12:07 PM EDT To: [facility]  Hello all  Patient was seen today and she has not heard from [employer] since her conversation with Mariann Laster in January so she was following up. Could one of you please follow up   Thanks  [place]

## 2022-02-21 NOTE — Telephone Encounter (Signed)
Sent mychart message

## 2022-03-22 ENCOUNTER — Encounter (HOSPITAL_COMMUNITY): Payer: Self-pay | Admitting: Emergency Medicine

## 2022-03-22 ENCOUNTER — Emergency Department (HOSPITAL_COMMUNITY)
Admission: EM | Admit: 2022-03-22 | Discharge: 2022-03-22 | Disposition: A | Discharge status: Home / Self Care | Payer: BC Managed Care – PPO | Attending: Emergency Medicine | Admitting: Emergency Medicine

## 2022-03-22 DIAGNOSIS — M545 Low back pain, unspecified: Secondary | ICD-10-CM | POA: Diagnosis not present

## 2022-03-22 DIAGNOSIS — Z79899 Other long term (current) drug therapy: Secondary | ICD-10-CM | POA: Insufficient documentation

## 2022-03-22 DIAGNOSIS — J45909 Unspecified asthma, uncomplicated: Secondary | ICD-10-CM | POA: Diagnosis not present

## 2022-03-22 DIAGNOSIS — M5441 Lumbago with sciatica, right side: Secondary | ICD-10-CM | POA: Insufficient documentation

## 2022-03-22 DIAGNOSIS — I10 Essential (primary) hypertension: Secondary | ICD-10-CM | POA: Diagnosis not present

## 2022-03-22 MED ORDER — DEXAMETHASONE SODIUM PHOSPHATE 10 MG/ML IJ SOLN
10.0000 mg | Freq: Once | INTRAMUSCULAR | Status: AC
  Administered 2022-03-22: 10 mg via INTRAMUSCULAR
  Filled 2022-03-22: qty 1

## 2022-03-22 MED ORDER — METHYLPREDNISOLONE 4 MG PO TBPK: ORAL_TABLET | ORAL | 0 refills | Status: DC

## 2022-03-22 NOTE — ED Provider Notes (Signed)
[facility]-EMERGENCY DEPT Provider Note   CSN: [id number] Arrival date & time: 03/22/22  1214     History  Chief Complaint  Patient presents with   Back Pain    Brittany Mcintyre is a 36 y.o. female.  Patient presents to the hospital complaining of 4 days of bilateral low back pain with right-sided symptoms going down to the right knee.  Pain down the right side is described as shooting/burning in nature.  Patient denies any injury or trauma.  States she woke up that day and began to have low back pain.  States she is use Tylenol and a heating pad at home with little relief.  Patient denies saddle anesthesia, urinary incontinence, urinary retention, fecal incontinence.  Patient denies any history of similar pain in the past.  Patient has past medical history significant for morbid obesity, asthma, hypertension, obstructive sleep apnea  HPI     Home Medications Prior to Admission medications   Medication Sig Start Date End Date Taking? Authorizing Provider  methylPREDNISolone (MEDROL DOSEPAK) 4 MG TBPK tablet Take as directed per package instructions 03/22/22  Yes Darrick Grinder, PA-C  amLODIPine-Valsartan-HCTZ (EXFORGE HCT) 10-320-25 MG TABS Take 1 tablet by mouth daily. 02/20/22   Chilton Si, MD  cetirizine (ZYRTEC) 10 MG tablet Take 10 mg by mouth daily as needed for allergies.    [provider]  Multiple Vitamins-Minerals (MULTIVITAMIN ADULT PO) Take 1 tablet by mouth daily.    [provider]      Allergies    Shellfish allergy    Review of Systems   Review of Systems  Musculoskeletal:  Positive for arthralgias and back pain.    Physical Exam Updated Vital Signs BP (!) 156/116   Pulse 85   Temp 97.9 F (36.6 C)   Resp 18   Ht 5\' 7"  (1.702 m)   Wt (!) 147 kg   LMP 03/03/2022 (Approximate)   SpO2 99%   BMI 50.75 kg/m  Physical Exam HENT:     Head: Normocephalic and atraumatic.     Mouth/Throat:     Mouth: Mucous  membranes are moist.  Eyes:     Pupils: Pupils are equal, round, and reactive to light.  Cardiovascular:     Rate and Rhythm: Normal rate.  Pulmonary:     Effort: Pulmonary effort is normal. No respiratory distress.  Musculoskeletal:        General: No signs of injury.     Cervical back: Normal range of motion.  Skin:    General: Skin is dry.  Neurological:     Mental Status: She is alert.     Comments: Alert, oriented, thought content appropriate. Speech fluent without evidence of aphasia. Able to follow 2 step commands without difficulty. Normal speed of response.  Cranial Nerves:  II:  Peripheral visual fields grossly normal, pupils, round, reactive to light III,IV, VI: ptosis not present, extra-ocular motions intact bilaterally  V,VII: smile symmetric, facial light touch sensation equal VIII: hearing grossly normal bilaterally  IX,X: midline uvula rise  XI: bilateral shoulder shrug equal and strong XII: midline tongue extension  Motor:  5/5 in upper and lower extremities bilaterally including strong and equal grip strength and dorsiflexion/plantar flexion Gait: normal gait and balance   Psychiatric:        Speech: Speech normal.        Behavior: Behavior normal.     ED Results / Procedures / Treatments   Labs (all labs ordered are listed,  but only abnormal results are displayed) Labs Reviewed - No data to display  EKG None  Radiology No results found.  Procedures Procedures    Medications Ordered in ED Medications  dexamethasone (DECADRON) injection 10 mg (has no administration in time range)    ED Course/ Medical Decision Making/ A&P                           Medical Decision Making  Patient presents the chief complaint of low back pain.  Differential diagnosis includes degenerative disc disease, lumbar radiculopathy, fracture, dislocation, cauda equina, and others  I reviewed the patient's past medical history including recent cardiology charts due to  high blood pressure  There is no indication at this time for lab work.  The patient has no red flag symptoms such as saddle anesthesia, urinary incontinence or retention, fecal incontinence.  Patient also has no recent injury or trauma to the area.  I see no indication at this time for imaging as the symptoms are only 57 days old  I ordered the patient Decadron for inflammation.  Upon reassessment the patient was feeling the same  Very low clinical suspicion of fracture, dislocation, cauda equina.  This seems likely to be degenerative disc disease or a lumbar strain with lumbar radiculopathy.  Plan to discharge patient home on a steroid taper pack and have her take over-the-counter anti-inflammatories along with Tylenol as needed.  She may continue to use a heating pad at home.  Patient recommended to follow-up with primary care as they may recommend physical therapy.  Patient given return precautions including red flag symptoms mentioned above.        Final Clinical Impression(s) / ED Diagnoses Final diagnoses:  Acute bilateral low back pain with right-sided sciatica    Rx / DC Orders ED Discharge Orders          Ordered    methylPREDNISolone (MEDROL DOSEPAK) 4 MG TBPK tablet        03/22/22 1249              Ronny Bacon 03/22/22 1249    Carmin Muskrat, MD 03/22/22 1534

## 2022-03-22 NOTE — ED Triage Notes (Signed)
Pt arriving with complaint of lower back pain that radiates into her right hip. Pt denies any recent falls/injuries. Rating pain 8/10 at this time. Denies urinary symptoms. Pt ambulatory.

## 2022-03-22 NOTE — Discharge Instructions (Addendum)
You were seen today for low back pain with right-sided leg involvement.  This may be due to a pinched nerve in your lower back.  Please take the prescribed steroid Dosepak and use over-the-counter anti-inflammatories such as ibuprofen or naproxen.  I recommend following up with your primary care provider if you continue have low back pain.  If you develop any red flag symptoms such as incontinence, urinary retention, or numbness in the groin region, return for reevaluation in the emergency department

## 2022-04-10 DIAGNOSIS — Z0000 Encounter for general adult medical examination without abnormal findings: Secondary | ICD-10-CM | POA: Diagnosis not present

## 2022-04-10 DIAGNOSIS — E669 Obesity, unspecified: Secondary | ICD-10-CM | POA: Diagnosis not present

## 2022-04-10 DIAGNOSIS — Z136 Encounter for screening for cardiovascular disorders: Secondary | ICD-10-CM | POA: Diagnosis not present

## 2022-04-10 DIAGNOSIS — Z131 Encounter for screening for diabetes mellitus: Secondary | ICD-10-CM | POA: Diagnosis not present

## 2022-04-10 DIAGNOSIS — I10 Essential (primary) hypertension: Secondary | ICD-10-CM | POA: Diagnosis not present

## 2022-04-17 DIAGNOSIS — E559 Vitamin D deficiency, unspecified: Secondary | ICD-10-CM | POA: Diagnosis not present

## 2022-04-17 DIAGNOSIS — E785 Hyperlipidemia, unspecified: Secondary | ICD-10-CM | POA: Diagnosis not present

## 2022-04-17 DIAGNOSIS — G4730 Sleep apnea, unspecified: Secondary | ICD-10-CM | POA: Diagnosis not present

## 2022-04-17 DIAGNOSIS — E669 Obesity, unspecified: Secondary | ICD-10-CM | POA: Diagnosis not present

## 2022-04-17 DIAGNOSIS — E782 Mixed hyperlipidemia: Secondary | ICD-10-CM | POA: Insufficient documentation

## 2022-04-18 ENCOUNTER — Encounter (INDEPENDENT_AMBULATORY_CARE_PROVIDER_SITE_OTHER): Payer: Self-pay

## 2022-04-23 DIAGNOSIS — Z634 Disappearance and death of family member: Secondary | ICD-10-CM | POA: Diagnosis not present

## 2022-04-23 DIAGNOSIS — F331 Major depressive disorder, recurrent, moderate: Secondary | ICD-10-CM | POA: Diagnosis not present

## 2022-05-04 DIAGNOSIS — G4733 Obstructive sleep apnea (adult) (pediatric): Secondary | ICD-10-CM | POA: Diagnosis not present

## 2022-05-14 DIAGNOSIS — F331 Major depressive disorder, recurrent, moderate: Secondary | ICD-10-CM | POA: Diagnosis not present

## 2022-05-14 DIAGNOSIS — Z634 Disappearance and death of family member: Secondary | ICD-10-CM | POA: Diagnosis not present

## 2022-05-25 ENCOUNTER — Telehealth: Payer: Self-pay | Admitting: Cardiovascular Disease

## 2022-05-25 ENCOUNTER — Encounter: Payer: BC Managed Care – PPO | Attending: Nurse Practitioner | Admitting: Dietician

## 2022-05-25 ENCOUNTER — Encounter: Payer: Self-pay | Admitting: Dietician

## 2022-05-25 VITALS — Ht 67.0 in | Wt 329.0 lb

## 2022-05-25 DIAGNOSIS — Z6841 Body Mass Index (BMI) 40.0 and over, adult: Secondary | ICD-10-CM | POA: Insufficient documentation

## 2022-05-25 NOTE — Progress Notes (Signed)
Medical Nutrition Therapy  Appointment Start time:  [phone]  Appointment End time:  [phone]  Primary concerns today: Pt wants to make better eating habits at a student or on a budget.    Referral diagnosis: E66.9 Preferred learning style: no preference indicated Learning readiness: ready   NUTRITION ASSESSMENT   Anthropometrics  Ht: 67 in Wt: 329 lbs  Clinical Medical Hx: asthma, HTN, prediabetes, sleep apnea Medications: Labs:  A1c 5.8% 01/02/22 Notable Signs/Symptoms: none Food Allergies: shellfish  Lifestyle & Dietary Hx Pt states there are some days she eats only one meal and will snack in between.  Pt is working on her PhD and wants to discuss easy, cheap, and quick meal options.   Pt enjoys cooking at home.  Pt states when she skips meals she is more likely to go out to eat.   Pt states she has a sweet tooth and notices her cravings more after dinner or when she skips meals.   Pt enjoys walking and is considering going to the gym.    Estimated daily fluid intake: 32 oz Supplements: MVI Sleep: 7-8 hours, started c-pap a few weeks ago Stress / self-care: moderate stress, uses coloring books or walks dog Current average weekly physical activity: ADLs  24-Hr Dietary Recall First Meal: cereal OR yogurt with granola OR skips Snack: none Second Meal: skips OR chips OR sandwich OR frozen pizza Snack: none Third Meal: chicken or fish or [place] with rice or potatoes and vegetable Snack: granola bar OR ice cream Beverages: juice almost daily (lemonade and fruit punch), water, soda 1-2x/wk   NUTRITION DIAGNOSIS  [place]-2.2 Altered nutrition-related laboratory As related to prediabetes.  As evidenced by A1c 5.8%.   NUTRITION INTERVENTION  Nutrition education (E-1) on the following topics:  Insulin resistance Prediabetes and A1c Building balanced meals and snacks Impact of physical activity on blood sugar MyPlate Whole vs refined grains Easy, quick, and budget grocery  options Consistent meal times Reducing added sugars  Handouts Provided Include  Dish Up a Healthy Meal Snack sheet  Learning Style & Readiness for Change Teaching method utilized: Visual & Auditory  Demonstrated degree of understanding via: Teach Back  Barriers to learning/adherence to lifestyle change: none  Goals Established by Pt Aim for 150 minutes of physical activity weekly. Goal: aim to go on a 15-20 minute walk 3 days a week.  Aim to make 1/2 of your plate vegetables and/or fruit at least 2x/day  Goal: aim to drink 3 water bottles daily.    MONITORING & EVALUATION Dietary intake, weekly physical activity, and follow up in 3 months.  Next Steps  Patient is to call for questions.

## 2022-05-25 NOTE — Telephone Encounter (Signed)
Spoke with pharmacy staff, the incorrect NDC number was ordered, they are going to order correct one.

## 2022-05-25 NOTE — Patient Instructions (Addendum)
Aim for 150 minutes of physical activity weekly. Goal: aim to go on a 15-20 minute walk 3 days a week.  Eat more Non-Starchy Vegetables and Fruits. Aim to make 1/2 of your plate vegetables and/or fruit at least 2x/day  Goal: aim to drink 3 water bottles daily.  Rethink what you drink. Choose beverages without added sugar. Look for 0 carbs on the label.  Choose whole foods over processed. Make simple meals at home more often than eating out.  Aim to eat within 1-2 hours of waking up and every 3-5 hours following.

## 2022-05-25 NOTE — Telephone Encounter (Signed)
Pt c/o medication issue:  1. Name of Medication: amLODIPine-Valsartan-HCTZ (EXFORGE HCT) 10-320-25 MG TABS [[id number]]   2. How are you currently taking this medication (dosage and times per day)? na  3. Are you having a reaction (difficulty breathing--STAT)? No   4. What is your medication issue? [facility] pharmacy called in and stated this med has been discouintined and would like to know what is the alterative   Best number [phone]

## 2022-06-03 DIAGNOSIS — G4733 Obstructive sleep apnea (adult) (pediatric): Secondary | ICD-10-CM | POA: Diagnosis not present

## 2022-06-18 DIAGNOSIS — R635 Abnormal weight gain: Secondary | ICD-10-CM | POA: Insufficient documentation

## 2022-07-04 DIAGNOSIS — G4733 Obstructive sleep apnea (adult) (pediatric): Secondary | ICD-10-CM | POA: Diagnosis not present

## 2022-07-05 ENCOUNTER — Encounter (INDEPENDENT_AMBULATORY_CARE_PROVIDER_SITE_OTHER): Payer: Self-pay | Admitting: Family Medicine

## 2022-07-09 DIAGNOSIS — F331 Major depressive disorder, recurrent, moderate: Secondary | ICD-10-CM | POA: Diagnosis not present

## 2022-07-09 DIAGNOSIS — Z634 Disappearance and death of family member: Secondary | ICD-10-CM | POA: Diagnosis not present

## 2022-07-12 ENCOUNTER — Ambulatory Visit: Payer: BC Managed Care – PPO | Admitting: Cardiovascular Disease

## 2022-07-15 ENCOUNTER — Encounter (HOSPITAL_BASED_OUTPATIENT_CLINIC_OR_DEPARTMENT_OTHER): Payer: Self-pay | Admitting: Cardiovascular Disease

## 2022-07-20 MED ORDER — AMLODIPINE-VALSARTAN-HCTZ 10-160-25 MG PO TABS: 1.0000 | ORAL_TABLET | Freq: Every day | ORAL | 3 refills | Status: DC

## 2022-07-20 NOTE — Telephone Encounter (Signed)
Recommend resume at Amlodipine-Valsartan-HCTZ 10-160-24m daily. Please provide 30 day Rx with 2 refills incase we have to change at her upcoming appointment with Dr. ROval Linsey If she has issues with cost of medications would encourage her to reach out to our office rather than not taking. We usually have resources to assist. Continue to check BP twice per day at least one hour after medications.   CLoel Dubonnet NP

## 2022-07-23 DIAGNOSIS — Z634 Disappearance and death of family member: Secondary | ICD-10-CM | POA: Diagnosis not present

## 2022-07-23 DIAGNOSIS — F331 Major depressive disorder, recurrent, moderate: Secondary | ICD-10-CM | POA: Diagnosis not present

## 2022-08-03 DIAGNOSIS — G4733 Obstructive sleep apnea (adult) (pediatric): Secondary | ICD-10-CM | POA: Diagnosis not present

## 2022-08-07 NOTE — Progress Notes (Incomplete)
Advanced Hypertension Clinic Follow-up:    Date:  08/07/2022   ID:  Brittany Mcintyre, DOB Feb 21, 1986, MRN [id number]  PCP:  [facility]  Cardiologist:  None  Nephrologist:  Referring MD: [facility]   CC: Hypertension  History of Present Illness:    Brittany Mcintyre is a 37 y.o. female with a hx of hypertension, and OSA, here for follow-up.  She noted that she was first diagnosed with hypertension in her teens.  She did not recall having any work-up for why she developed hypertension at such a young age.  She started taking medications in 2019.  She noted that she is interested in potentially having a baby sometime in the next several years but does not yet have a partner. At her initial visit, her blood pressure was 131/94. She was enrolled in the PREP program at [facility]. She had renal artery dopplers 03/2021 that were normal. She was also referred for a sleep study. We discussed switching off of an ARB if she did decide to become pregnant. Given she did not have a partner at the time, she was switched from losartan to valsartan.   At her last visit she reported blood pressures similar to 133/89 on average. Her amlodipine was increased. She also complained of LE edema with no significant change in the interim. She was encouraged to keep exercising. Today, she reports successfully losing weight. She has been walking more frequently. At first this was difficult, but she has noticed improved exercise tolerance. She is also cooking more meals at home with monitoring her salt intake. In clinic today her blood pressure is 116/83 which she notes is typical of her readings at home. She occasionally notices blood pressures a little over 120/80. Of note she has not yet received her CPAP; she has not been contacted recently. Additionally she complains of a non-healing wound on her chest that initially began as a blister. She denies fevers, chills, erythema, or associated  pain. She denies any palpitations, chest pain, shortness of breath, or peripheral edema. No lightheadedness, headaches, syncope, orthopnea, or PND.  Previous antihypertensives: Losartan/HCTZ   Past Medical History:  Diagnosis Date   Asthma    Essential hypertension 02/08/2021   Morbid obesity ([place]) 02/08/2021   OSA (obstructive sleep apnea) 06/23/2021    No past surgical history on file.  Current Medications: No outpatient medications have been marked as taking for the 08/10/22 encounter (Appointment) with Skeet Latch, MD.     Allergies:   Shellfish allergy   Social History   Socioeconomic History   Marital status: Single    Spouse name: Not on file   Number of children: Not on file   Years of education: Not on file   Highest education level: Not on file  Occupational History   Not on file  Tobacco Use   Smoking status: Never   Smokeless tobacco: Never  Substance and Sexual Activity   Alcohol use: No   Drug use: Not on file   Sexual activity: Not on file  Other Topics Concern   Not on file  Social History Narrative   Not on file   Social Determinants of Health   Financial Resource Strain: Low Risk  (02/08/2021)   Overall Financial Resource Strain (CARDIA)    Difficulty of Paying Living Expenses: Not hard at all  Food Insecurity: No Food Insecurity (02/08/2021)   Hunger Vital Sign    Worried About Running Out of Food in the Last  Year: Never true    Happy in the Last Year: Never true  Transportation Needs: No Transportation Needs (02/08/2021)   PRAPARE - Hydrologist (Medical): No    Lack of Transportation (Non-Medical): No  Physical Activity: Inactive (02/08/2021)   Exercise Vital Sign    Days of Exercise per Week: 0 days    Minutes of Exercise per Session: 0 min  Stress: Not on file  Social Connections: Not on file     Family History: The patient's family history includes Diabetes in her mother; Heart attack in her  maternal grandmother and mother; Hypertension in her father, maternal grandfather, maternal grandmother, mother, and paternal grandmother; Stroke in her paternal grandmother.  ROS:   Please see the history of present illness.    (+) Non-healing lesion of chest All other systems reviewed and are negative.  EKGs/Labs/Other Studies Reviewed:    Renal Artery Doppler 03/15/21 Largest Aortic Diameter: 2.4 cm  Renal:     Right: Normal size right kidney. Normal right Resisitive Index.         Normal cortical thickness of right kidney. No evidence of         right renal artery stenosis. RRV flow present.  Left:  Normal size of left kidney. Normal left Resistive Index.         Normal cortical thickness of the left kidney. No evidence of         left renal artery stenosis. LRV flow present.  Mesenteric:  Normal Celiac artery and Superior Mesenteric artery findings.   EKG: EKG was personally reviewed. 02/20/2022:  EKG was not ordered. 01/02/2022:  Sinus rhythm. Rate 85 bpm. Left axis deviation. Low voltage. 02/08/21: EKG is sinus rhythm.  Rate 91 bpm.  Low voltage.    Recent Labs: No results found for requested labs within last 365 days.   Recent Lipid Panel No results found for: "CHOL", "TRIG", "HDL", "CHOLHDL", "VLDL", "LDLCALC", "LDLDIRECT"  Physical Exam:    VS:  There were no vitals taken for this visit. , BMI There is no height or weight on file to calculate BMI. GENERAL:  Well appearing HEENT: Pupils equal round and reactive, fundi not visualized, oral mucosa unremarkable NECK:  No jugular venous distention, waveform within normal limits, carotid upstroke brisk and symmetric, no bruits LUNGS:  Clear to auscultation bilaterally HEART:  RRR.  PMI not displaced or sustained,S1 and S2 within normal limits, no S3, no S4, no clicks, no rubs, no murmurs ABD:  Flat, positive bowel sounds normal in frequency in pitch, no bruits, no rebound, no guarding, no midline pulsatile mass, no  hepatomegaly, no splenomegaly EXT:  2 plus pulses throughout, no edema, no cyanosis no clubbing SKIN:  No rashes no nodules NEURO:  Cranial nerves II through XII grossly intact, motor grossly intact throughout PSYCH:  Cognitively intact, oriented to person place and time   ASSESSMENT/PLAN:    No problem-specific Assessment & Plan notes found for this encounter.    Screening for Secondary Hypertension:     02/08/2021    4:03 PM  Causes  Drugs/Herbals Screened     - Comments limited caffeine.  Renovascular HTN Screened     - Comments check renal Dopplers  Sleep Apnea Screened     - Comments will get sleep study  Thyroid Disease Screened  Hyperaldosteronism N/A  Pheochromocytoma N/A  Cushing's Syndrome N/A  Hyperparathyroidism N/A  Coarctation of the Aorta Screened     - Comments  BP symmetric  Compliance Screened     - Comments struggles but she is improving.    Relevant Labs/Studies:    Latest Ref Rng & Units 03/14/2021   11:17 AM  Basic Labs  Sodium 134 - 144 mmol/L 138   Potassium 3.5 - 5.2 mmol/L 3.6   Creatinine 0.57 - 1.00 mg/dL 0.82                    03/15/2021   10:09 AM  Renovascular   Renal Artery [place] Completed Yes     Disposition:  FU with Tiffany C. Oval Linsey, MD, [facility] in 6 months.  Medication Adjustments/Labs and Tests Ordered: Current medicines are reviewed at length with the patient today.  Concerns regarding medicines are outlined above.   No orders of the defined types were placed in this encounter.  No orders of the defined types were placed in this encounter.  I,Mathew Stumpf,acting as a Education administrator for Skeet Latch, MD.,have documented all relevant documentation on the behalf of Skeet Latch, MD,as directed by  Skeet Latch, MD while in the presence of Skeet Latch, MD.  I, [place] Oval Linsey, MD have reviewed all documentation for this visit.  The documentation of the exam, diagnosis, procedures, and orders on 08/07/2022 are  all accurate and complete.  Waynetta Pean  08/07/2022 4:35 PM    [facility]

## 2022-08-09 ENCOUNTER — Encounter (HOSPITAL_BASED_OUTPATIENT_CLINIC_OR_DEPARTMENT_OTHER): Payer: Self-pay | Admitting: *Deleted

## 2022-08-10 ENCOUNTER — Ambulatory Visit (HOSPITAL_BASED_OUTPATIENT_CLINIC_OR_DEPARTMENT_OTHER): Payer: BC Managed Care – PPO | Admitting: Cardiovascular Disease

## 2022-08-13 DIAGNOSIS — F331 Major depressive disorder, recurrent, moderate: Secondary | ICD-10-CM | POA: Diagnosis not present

## 2022-08-13 DIAGNOSIS — Z634 Disappearance and death of family member: Secondary | ICD-10-CM | POA: Diagnosis not present

## 2022-08-21 DIAGNOSIS — Z01419 Encounter for gynecological examination (general) (routine) without abnormal findings: Secondary | ICD-10-CM | POA: Diagnosis not present

## 2022-08-25 ENCOUNTER — Ambulatory Visit: Payer: BC Managed Care – PPO | Admitting: Dietician

## 2022-08-28 ENCOUNTER — Encounter: Payer: Self-pay | Admitting: Cardiovascular Disease

## 2022-08-28 ENCOUNTER — Ambulatory Visit: Payer: BC Managed Care – PPO | Attending: Cardiovascular Disease | Admitting: Cardiovascular Disease

## 2022-08-28 VITALS — BP 112/72 | HR 81 | Ht 67.0 in | Wt 323.0 lb

## 2022-08-28 DIAGNOSIS — G4733 Obstructive sleep apnea (adult) (pediatric): Secondary | ICD-10-CM | POA: Diagnosis not present

## 2022-08-28 DIAGNOSIS — R0683 Snoring: Secondary | ICD-10-CM | POA: Diagnosis not present

## 2022-08-28 DIAGNOSIS — I10 Essential (primary) hypertension: Secondary | ICD-10-CM

## 2022-08-28 DIAGNOSIS — G4736 Sleep related hypoventilation in conditions classified elsewhere: Secondary | ICD-10-CM

## 2022-08-28 NOTE — Progress Notes (Signed)
Cardiology Office Note    Date:  09/04/2022   ID:  Brittany Mcintyre, DOB 1985/09/10, MRN [id number]  PCP:  [facility]  Cardiologist:  Shelva Majestic, MD(sleep); Dr. Skeet Latch  New sleep consultation referred by Dr. Skeet Latch for sleep evaluation with recent initiation of CPAP therapy.  History of Present Illness:  Brittany Mcintyre is a 37 y.o. female who [facility].  She sees Dr. Skeet Latch for hypertension.  She has a longstanding history of hypertension initially diagnosed when she was in her 24s.  She has been on antihypertensive medication since 2019.  Renal Doppler studies in October 2022 were normal.  Due to concerns for obstructive sleep apnea, she was referred for a diagnostic polysomnogram on May 22, 2021.  She was found to have moderate overall sleep apnea with an AHI of 27.9/h.  She had severe sleep apnea during REM sleep with an AHI of 81.6/h.  AHI while supine was 25.2/h.  There was significant oxygen desaturation to a nadir of 80% and she had loud snoring throughout her study.  Apparently she was unable to have an in lab therapeutic CPAP titration, and ultimately initiated AutoPap therapy after receiving a new ResMed AirSense 10 AutoSet unit on May 04, 2022 with [employer] as her DME company.  A download was obtained from November 30 through June 02, 2022.  She just barely met compliance standards with usage days at 87% and days with usage greater than 4 hours at 70%.  Average use, however was suboptimal at only 4 hours and 39 minutes of CPAP use per night.  At a pressure range of 7 to 20 cm of water, AHI was 2.3/h.  Her 95th percentile pressure was 11.9 and maximum average pressure 13.3 cm.  A subsequent download from February 22 through August 25, 2022 showed improved CPAP compliance now with average use of 7 hours and 24 minutes.  AHI was 3.6 and her 95th percentile pressure was 13.9 with  maximum average pressure at 15.5.  She believes she is sleeping better since initiating CPAP therapy.  Often goes to bed late between 11 PM and midnight and wakes up around 7-7 30.  Her sleep is more restful.  Prior to initiating CPAP therapy her sleep is nonrestorative.  She had significant daytime sleepiness, she had loud snoring, and she had frequent nocturia of 3-4 times per night which has improved to 0-1 time per night.  She continues to be on amlodipine/valsartan/HCTZ 10/160/25 mg daily for blood pressure control.  She presents for her initial sleep consultation and evaluation.   Past Medical History:  Diagnosis Date   Asthma    Essential hypertension 02/08/2021   Morbid obesity 02/08/2021   OSA (obstructive sleep apnea) 06/23/2021    History reviewed. No pertinent surgical history.  Current Medications: Outpatient Medications Prior to Visit  Medication Sig Dispense Refill   amLODIPine-Valsartan-HCTZ 10-160-25 MG TABS Take 1 tablet by mouth daily. 30 tablet 3   cetirizine (ZYRTEC) 10 MG tablet Take 10 mg by mouth daily as needed for allergies.     methylPREDNISolone (MEDROL DOSEPAK) 4 MG TBPK tablet Take as directed per package instructions 21 tablet 0   Multiple Vitamins-Minerals (MULTIVITAMIN ADULT PO) Take 1 tablet by mouth daily.     No facility-administered medications prior to visit.     Allergies:   Shellfish allergy   Social History   Socioeconomic History   Marital status: Single    Spouse  name: Not on file   Number of children: Not on file   Years of education: Not on file   Highest education level: Not on file  Occupational History   Not on file  Tobacco Use   Smoking status: Never   Smokeless tobacco: Never  Substance and Sexual Activity   Alcohol use: No   Drug use: Not on file   Sexual activity: Not on file  Other Topics Concern   Not on file  Social History Narrative   Not on file   Social Determinants of Health   Financial Resource Strain: Low Risk   (02/08/2021)   Overall Financial Resource Strain (CARDIA)    Difficulty of Paying Living Expenses: Not hard at all  Food Insecurity: No Food Insecurity (02/08/2021)   Hunger Vital Sign    Worried About Running Out of Food in the Last Year: Never true    [place] in the Last Year: Never true  Transportation Needs: No Transportation Needs (02/08/2021)   PRAPARE - Hydrologist (Medical): No    Lack of Transportation (Non-Medical): No  Physical Activity: Inactive (02/08/2021)   Exercise Vital Sign    Days of Exercise per Week: 0 days    Minutes of Exercise per Session: 0 min  Stress: Not on file  Social Connections: Not on file    Socially, she was born in [place].  She is single and has no children.  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  Family History:  The patient's family history includes Diabetes in her mother; Heart attack in her maternal grandmother and mother; Hypertension in her father, maternal grandfather, maternal grandmother, mother, and paternal grandmother; Stroke in her paternal grandmother.   Her father is living, age 62 with hypertension.  Mother died at 75 and had myocardial infarction, hypertension and diabetes.  She has 1 brother.  ROS General: Negative; No fevers, chills, or night sweats; morbid obesity HEENT: Negative; No changes in vision or hearing, sinus congestion, difficulty swallowing Pulmonary: Negative; No cough, wheezing, shortness of breath, hemoptysis Cardiovascular: History of hypertension since her teens GI: Negative; No nausea, vomiting, diarrhea, or abdominal pain GU: Negative; No dysuria, hematuria, or difficulty voiding Musculoskeletal: Negative; no myalgias, joint pain, or weakness Hematologic/Oncology: Negative; no easy bruising, bleeding Endocrine: Negative; no heat/cold intolerance; no diabetes Neuro: Negative; no changes in balance, headaches Skin: Negative; No rashes or skin  lesions Psychiatric: Negative; No behavioral problems, depression Sleep: Negative; No snoring, daytime sleepiness, hypersomnolence, bruxism, restless legs, hypnogognic hallucinations, no cataplexy Other comprehensive 14 point system review is negative.   PHYSICAL EXAM:   VS:  BP 112/72 (BP Location: Right Arm, Patient Position: Sitting, Cuff Size: Large)   Pulse 81   Ht 5\' 7"  (1.702 m)   Wt (!) 323 lb (146.5 kg)   BMI 50.59 kg/m     Repeat blood pressure by me was 128/78  Wt Readings from Last 3 Encounters:  08/28/22 (!) 323 lb (146.5 kg)  05/25/22 (!) 329 lb (149.2 kg)  03/22/22 (!) 324 lb (147 kg)    General: Alert, oriented, no distress.  Super morbid obesity Skin: normal turgor, no rashes, warm and dry HEENT: Normocephalic, atraumatic. Pupils equal round and reactive to light; sclera anicteric; extraocular muscles intact; Nose without nasal septal hypertrophy Mouth/Parynx: Large tongue; Mallinpatti scale 4 Neck: No JVD, no carotid bruits; normal carotid upstroke Lungs: clear to ausculatation and percussion; no wheezing or rales Chest  wall: without tenderness to palpitation Heart: PMI not displaced, RRR, s1 s2 normal, 1/6 systolic murmur, no diastolic murmur, no rubs, gallops, thrills, or heaves Abdomen: soft, nontender; no hepatosplenomehaly, BS+; abdominal aorta nontender and not dilated by palpation. Back: no CVA tenderness Pulses 2+ Musculoskeletal: full range of motion, normal strength, no joint deformities Extremities: no clubbing cyanosis or edema, Homan's sign negative  Neurologic: grossly nonfocal; Cranial nerves grossly wnl Psychologic: Normal mood and affect   Studies/Labs Reviewed:   August 27, 2022 ECG (independently read by me): NSR at 81, low voltage, no significant ST changes  Recent Labs:    Latest Ref Rng & Units 03/14/2021   11:17 AM  BMP  Glucose 70 - 99 mg/dL 86   BUN 6 - 20 mg/dL 9   Creatinine 0.57 - 1.00 mg/dL 0.82   BUN/Creat Ratio 9 - 23  11   Sodium 134 - 144 mmol/L 138   Potassium 3.5 - 5.2 mmol/L 3.6   Chloride 96 - 106 mmol/L 102   CO2 20 - 29 mmol/L 24   Calcium 8.7 - 10.2 mg/dL 9.4          No data to display              No data to display         No results found for: "MCV" No results found for: "TSH" Lab Results  Component Value Date   HGBA1C 5.8 (H) 01/02/2022     BNP No results found for: "BNP"  ProBNP No results found for: "PROBNP"   Lipid Panel  No results found for: "CHOL", "TRIG", "HDL", "CHOLHDL", "VLDL", "LDLCALC", "LDLDIRECT", "LABVLDL"   RADIOLOGY: No results found.   Additional studies/ records that were reviewed today include:    Patient Name: Corniya, Neller Date: 05/22/2021 Gender: Female D.O.B: May 09, 1986 Age (years): 35 Referring Provider: Skeet Latch Height (inches): 59 Interpreting Physician: Shelva Majestic MD, ABSM Weight (lbs): 325 RPSGT: Gwenyth Allegra BMI: 51 MRN: [id number] Neck Size: 17.00   CLINICAL INFORMATION Sleep Study Type: NPSG   Indication for sleep study: Hypertension, Obesity   Epworth Sleepiness Score: 5   SLEEP STUDY TECHNIQUE As per the AASM Manual for the Scoring of Sleep and Associated Events v2.3 (April 2016) with a hypopnea requiring 4% desaturations.   The channels recorded and monitored were frontal, central and occipital EEG, electrooculogram (EOG), submentalis EMG (chin), nasal and oral airflow, thoracic and abdominal wall motion, anterior tibialis EMG, snore microphone, electrocardiogram, and pulse oximetry.   MEDICATIONS amLODipine (NORVASC) 5 MG tablet cetirizine (ZYRTEC) 10 MG tablet metFORMIN (GLUCOPHAGE-XR) 500 MG 24 hr tablet Multiple Vitamins-Minerals (MULTIVITAMIN ADULT PO) valsartan-hydrochlorothiazide (DIOVAN-HCT) 320-25 MG tablet  Medications self-administered by patient taken the night of the study : N/A   SLEEP ARCHITECTURE The study was initiated at 11:02:27 PM and ended at 5:05:25 AM.    Sleep onset time was 95.3 minutes and the sleep efficiency was 66.9%%. The total sleep time was 243 minutes.   Stage REM latency was 108.0 minutes.   The patient spent 10.3%% of the night in stage N1 sleep, 69.8%% in stage N2 sleep, 0.0%% in stage N3 and 20% in REM.   Alpha intrusion was absent.   Supine sleep was 62.76%.   RESPIRATORY PARAMETERS The overall apnea/hypopnea index (AHI) was 27.9 per hour. The respiratory disturbance indec (RDI) was 29.4/h. There were 4 total apneas, including 4 obstructive, 0 central and 0 mixed apneas. There were 109 hypopneas and 6 RERAs.   The AHI  during Stage REM sleep was 81.6 per hour.   AHI while supine was 25.2 per hour.   The mean oxygen saturation was 93.6%. The minimum SpO2 during sleep was 80.0%.   Loud snoring was noted during this study.   CARDIAC DATA The 2 lead EKG demonstrated sinus rhythm. The mean heart rate was 85.1 beats per minute. Other EKG findings include: None.   LEG MOVEMENT DATA The total PLMS were 0 with a resulting PLMS index of 0.0. Associated arousal with leg movement index was 0.0 .   IMPRESSIONS - Moderate obstructive sleep apnea occurred during this study (AHI 27.9/h; RDI 29.4/h); however, events were very severe duiring REM sleep (AHI 81.6/h). - Moderate oxygen desaturation to a nadir of 80.0%. - The patient snored with loud snoring volume. - No cardiac abnormalities were noted during this study. - Clinically significant periodic limb movements did not occur during sleep. No significant associated arousals.   DIAGNOSIS - Obstructive Sleep Apnea (G47.33) - Nocturnal Hypoxemia (G47.36)   RECOMMENDATIONS - In this patient with significant cardiovascular co-miorbidities recommend an in-lab therapeutic CPAP titration to determine optimal pressure required to alleviate sleep disordered breathing. If unable to have an in-lab study, initiate Auto-PAP with EPR of 3 at 7 - 20 cm of water. - Effort should be made to  optimize nasal and oropharyngeal patency. - Avoid alcohol, sedatives and other CNS depressants that may worsen sleep apnea and disrupt normal sleep architecture. - Sleep hygiene should be reviewed to assess factors that may improve sleep quality. - Weight management (BMI 51) and regular exercise should be initiated or continued if appropriate.     ASSESSMENT:    1. OSA (obstructive sleep apnea)   2. Essential hypertension   3. Morbid obesity ([place])   4. Snoring   5. Hypoxemia associated with sleep     PLAN:  Ms. Carlyne Hrabe is a 37 year old African-American female who has a longstanding history of hypertension since her teens.  She has been on medical therapy since 2019.  She is currently working on a PhD degree.  She has required triple drug therapy for blood pressure control and at present is on combination therapy with amlodipine/valsartan/HCTZ with a 10/160/25 mg combination pill.  She has had significant symptoms highly suggestive of obstructive sleep apnea with nonrestorative sleep, daytime sleepiness, snoring, frequent awakenings, nocturia 3-4 times per night in addition to her obesity and hypertension.  I thoroughly reviewed her diagnostic polysomnogram which was done on May 22, 2021.  This revealed moderate overall sleep apnea with an AHI of 27.9/h; however she had very severe sleep apnea during REM sleep with a REM AHI at 81.6/h.  There was loud snoring throughout the study and oxygen nadir was 80%.  I had a long discussion with her today to provide education and understanding of sleep apnea and its particular adverse consequences associated with her cardiovascular health.  I discussed normal sleep architecture which is parasympathetic nervous system driven and typically accounts for 75% of her night sleep.  REM sleep is sympathetic driven.  I discussed arousals creating increased sympathetic tone during her parasympathetic non-REM sleep which plays a role in contributing to her  blood pressure not lowering adequately doing sleep and associated with its effects on blood pressure elevation.  In addition I discussed its potential increased risk for nocturnal arrhythmias as well as potential for atrial fibrillation.  In addition, I discussed its negative effects on insulin resistance contributing to increased blood sugar, increased inflammatory markers, as well as  increased nocturnal GERD.  If she does have underlying atherosclerosis, I discussed potential nocturnal hypoxemia contributing to nocturnal ischemia both in the cardiac as well as cerebrovascular circulation.  In addition, I discussed the pathophysiology associated with increased urinary frequency with untreated sleep apnea.  Since she has been on CPAP therapy.  Nocturia has improved from 4 times per night down to 0-1 time per night.  Her sleep is much more restorative.  An Epworth Sleepiness Scale score was calculated today and this endorsed at 4 arguing against any residual daytime sleepiness.  Her blood pressure is well-controlled on her current medical regimen.  I discussed the importance of weight loss and increased exercise.  She is meeting compliance standards.  She is using ResMed AirFit F30i mask which she does well with.  However at times she is having mask leak.  I will change her pressure settings and will increase her ramp start pressure to 6 and change her pressure from a range of 7 to 20 cm to 10 - 20 centimeters of water.  She was very appreciative of the time I spent with her to provide education and understanding of her sleep disordered breathing.  I will see her in 1 year for reevaluation or sooner as needed.  Medication Adjustments/Labs and Tests Ordered: Current medicines are reviewed at length with the patient today.  Concerns regarding medicines are outlined above.  Medication changes, Labs and Tests ordered today are listed in the Patient Instructions below. Patient Instructions  Medication Instructions:    No changes  *If you need a refill on your cardiac medications before your next appointment, please call your pharmacy*   Lab Work: Not needed    Testing/Procedures:  Not needed  Follow-Up: At [facility], you and your health needs are our priority.  As part of our continuing mission to provide you with exceptional heart care, we have created designated Provider Care Teams.  These Care Teams include your primary Cardiologist (physician) and Advanced Practice Providers (APPs -  Physician Assistants and Nurse Practitioners) who all work together to provide you with the care you need, when you need it.     Your next appointment:   12 month(s)  The format for your next appointment:   In Person  Provider:    Dr Shelva Majestic    Other Instructions   pressure changes were done to your C-PAP - you will the changes tonight   -    Signed, Shelva Majestic, MD, [facility], ABSM Diplomate, [place] Board of Sleep Medicine    09/04/2022 [place] [address] [place], [place], [place]  [address] Phone: [phone]

## 2022-08-28 NOTE — Patient Instructions (Signed)
Medication Instructions:   No changes  *If you need a refill on your cardiac medications before your next appointment, please call your pharmacy*   Lab Work: Not needed    Testing/Procedures:  Not needed  Follow-Up: At [facility], you and your health needs are our priority.  As part of our continuing mission to provide you with exceptional heart care, we have created designated Provider Care Teams.  These Care Teams include your primary Cardiologist (physician) and Advanced Practice Providers (APPs -  Physician Assistants and Nurse Practitioners) who all work together to provide you with the care you need, when you need it.     Your next appointment:   12 month(s)  The format for your next appointment:   In Person  Provider:    Dr Shelva Majestic    Other Instructions   pressure changes were done to your C-PAP - you will the changes tonight   -

## 2022-08-30 DIAGNOSIS — G4733 Obstructive sleep apnea (adult) (pediatric): Secondary | ICD-10-CM | POA: Diagnosis not present

## 2022-09-04 ENCOUNTER — Encounter: Payer: Self-pay | Admitting: Cardiovascular Disease

## 2022-09-10 DIAGNOSIS — F331 Major depressive disorder, recurrent, moderate: Secondary | ICD-10-CM | POA: Diagnosis not present

## 2022-09-10 DIAGNOSIS — Z634 Disappearance and death of family member: Secondary | ICD-10-CM | POA: Diagnosis not present

## 2022-09-11 ENCOUNTER — Ambulatory Visit: Payer: BC Managed Care – PPO | Admitting: Podiatry

## 2022-09-11 ENCOUNTER — Encounter: Payer: Self-pay | Admitting: Podiatry

## 2022-09-11 DIAGNOSIS — L600 Ingrowing nail: Secondary | ICD-10-CM | POA: Diagnosis not present

## 2022-09-11 NOTE — Progress Notes (Signed)
  Subjective:  Patient ID: Brittany Mcintyre, female    DOB: [birth year],   MRN: [id number]  Chief Complaint  Patient presents with   Nail Problem     Left great toe nail possible ingrown     37 y.o. female presents for concern of left great toe ingrown nail.  Relates she has been dealing with this nail for a while and has come and gone. Relates she has had a procedure on the right years ago and wanted to know if she would need the same on the left. . Denies any other pedal complaints. Denies n/v/f/c.   Past Medical History:  Diagnosis Date   Asthma    Essential hypertension 02/08/2021   Morbid obesity 02/08/2021   OSA (obstructive sleep apnea) 06/23/2021    Objective:  Physical Exam: Vascular: DP/PT pulses 2/4 bilateral. CFT <3 seconds. Normal hair growth on digits. No edema.  Skin. No lacerations or abrasions bilateral feet. Incurvation of bilateral border of left great toenail. Non tender. No erythema edema or purulence noted.  Musculoskeletal: MMT 5/5 bilateral lower extremities in DF, PF, Inversion and Eversion. Deceased ROM in DF of ankle joint.  Neurological: Sensation intact to light touch.   Assessment:   1. Ingrown left greater toenail      Plan:  Patient was evaluated and treated and all questions answered. Discussed ingrown toenails etiology and treatment options including procedure for removal vs conservative care.  Discussed soaks and preventing ingrown nails with patient in detail.  Discussed procedure and patient will return in the future if she feels she needs it.  Return as needed.      Louann Sjogren, DPM

## 2022-09-13 ENCOUNTER — Ambulatory Visit (HOSPITAL_BASED_OUTPATIENT_CLINIC_OR_DEPARTMENT_OTHER): Payer: BC Managed Care – PPO | Admitting: Cardiovascular Disease

## 2022-09-13 ENCOUNTER — Encounter (HOSPITAL_BASED_OUTPATIENT_CLINIC_OR_DEPARTMENT_OTHER): Payer: Self-pay | Admitting: Cardiovascular Disease

## 2022-09-13 VITALS — BP 122/80 | HR 89 | Ht 67.0 in | Wt 320.0 lb

## 2022-09-13 DIAGNOSIS — G4733 Obstructive sleep apnea (adult) (pediatric): Secondary | ICD-10-CM

## 2022-09-13 DIAGNOSIS — E7800 Pure hypercholesterolemia, unspecified: Secondary | ICD-10-CM | POA: Diagnosis not present

## 2022-09-13 DIAGNOSIS — I10 Essential (primary) hypertension: Secondary | ICD-10-CM

## 2022-09-13 HISTORY — DX: Pure hypercholesterolemia, unspecified: E78.00

## 2022-09-13 NOTE — Assessment & Plan Note (Signed)
Working with our team to get a CPAP.

## 2022-09-13 NOTE — Patient Instructions (Signed)
Medication Instructions:  Your physician recommends that you continue on your current medications as directed. Please refer to the Current Medication list given to you today.   Labwork: FASTING LP/CMET SOON   Testing/Procedures: NONE  Follow-Up: Your physician wants you to follow-up in: 1 YEAR WITH DR [place] OR CAITLIN W NP You will receive a reminder letter in the mail two months in advance. If you don't receive a letter, please call our office to schedule the follow-up appointment.  If you need a refill on your cardiac medications before your next appointment, please call your pharmacy.

## 2022-09-13 NOTE — Progress Notes (Signed)
Advanced Hypertension Clinic Follow-up:    Date:  09/15/2022   ID:  Brittany Mcintyre, DOB 07-03-1985, MRN [id number]  PCP:  [facility]  Cardiologist:  Chilton Si, MD  Nephrologist:  Referring MD: [facility]   CC: Hypertension  History of Present Illness:    Brittany Mcintyre is a 37 y.o. female with a hx of hypertension, and OSA, here for follow-up.  She noted that she was first diagnosed with hypertension in her teens.  She did not recall having any work-up for why she developed hypertension at such a young age.  She started taking medications in 2019.  She noted that she is interested in potentially having a baby sometime in the next several years but does not yet have a partner. At her initial visit, her blood pressure was 131/94. She was enrolled in the PREP program at the [facility]. She had renal artery dopplers 03/2021 that were normal. She was also referred for a sleep study. We discussed switching off of an ARB if she did decide to become pregnant. Given she did not have a partner at the time, she was switched from losartan to valsartan.   At her visit 12/2021 She reported blood pressures similar to 133/89 on average. Her amlodipine was increased. She also complained of LE edema with no significant change in the interim. She was encouraged to keep exercising.   In 02/2022 she was doing well with weight loss. She noted improved exercise tolerance and was monitoring her sodium intake. She had not yet received her CPAP. Her blood pressure was nearly controlled both at home and in the office. We changed her regimen to a combination of amlodipine/valsartan/HCTZ. Per MyChart messages 07/15/2022 she had been off of her antihypertensive regimen for about 4 weeks and noted a BP reading of 148/95. She had been unable to pick it up due to high costs. Her amlodipine/valsartan/HCTZ was resumed at 10/160/25 mg daily.  Today, her main complaint is left upper arm  muscle aches with onset a couple weeks ago. This mostly occurs in the mornings when she wakes up, although she doesn't lie on her left side. Of note, she can still move her arm with normal ROM. In clinic her blood pressure is 122/80 which she states is similar to her home readings. Sometimes her BP is a little elevated depending on when she checks it or what she eats. It is noticeably worse with salty foods such as chips or ordering out. She has been able to obtain her medications now that they are changed to a 30-day supply. She has been trying to stay active with more walking. She will go for a walk at least 3-4 times a week if her school schedule allows. She walks for 15-20 minutes, not limited by any anginal symptoms or shortness of breath. She has been on Crestor 5 mg for a couple months prescribed by her PCP. She is tolerating it well. She denies any palpitations, chest pain, shortness of breath, or peripheral edema. No lightheadedness, headaches, syncope, orthopnea, or PND.  Previous antihypertensives: Losartan/HCTZ   Past Medical History:  Diagnosis Date   Asthma    Essential hypertension 02/08/2021   Morbid obesity 02/08/2021   OSA (obstructive sleep apnea) 06/23/2021   Pure hypercholesterolemia 09/13/2022    History reviewed. No pertinent surgical history.  Current Medications: Current Meds  Medication Sig   amLODIPine-Valsartan-HCTZ 10-160-25 MG TABS Take 1 tablet by mouth daily.   cetirizine (ZYRTEC) 10 MG tablet  Take 10 mg by mouth daily as needed for allergies.   Multiple Vitamins-Minerals (MULTIVITAMIN ADULT PO) Take 1 tablet by mouth daily.   rosuvastatin (CRESTOR) 5 MG tablet Take 5 mg by mouth daily.     Allergies:   Shellfish allergy   Social History   Socioeconomic History   Marital status: Single    Spouse name: Not on file   Number of children: Not on file   Years of education: Not on file   Highest education level: Not on file  Occupational History   Not on  file  Tobacco Use   Smoking status: Never   Smokeless tobacco: Never  Substance and Sexual Activity   Alcohol use: No   Drug use: Not on file   Sexual activity: Not on file  Other Topics Concern   Not on file  Social History Narrative   Not on file   Social Determinants of Health   Financial Resource Strain: Low Risk  (02/08/2021)   Overall Financial Resource Strain (CARDIA)    Difficulty of Paying Living Expenses: Not hard at all  Food Insecurity: No Food Insecurity (02/08/2021)   Hunger Vital Sign    Worried About Running Out of Food in the Last Year: Never true    Ran Out of Food in the Last Year: Never true  Transportation Needs: No Transportation Needs (02/08/2021)   PRAPARE - Administrator, Civil Service (Medical): No    Lack of Transportation (Non-Medical): No  Physical Activity: Inactive (02/08/2021)   Exercise Vital Sign    Days of Exercise per Week: 0 days    Minutes of Exercise per Session: 0 min  Stress: Not on file  Social Connections: Not on file     Family History: The patient's family history includes Diabetes in her mother; Heart attack in her maternal grandmother and mother; Hypertension in her father, maternal grandfather, maternal grandmother, mother, and paternal grandmother; Stroke in her paternal grandmother.  ROS:   Please see the history of present illness.    (+) LUE myalgias All other systems reviewed and are negative.  EKGs/Labs/Other Studies Reviewed:    Renal Artery Doppler 03/15/21 Largest Aortic Diameter: 2.4 cm  Renal:     Right: Normal size right kidney. Normal right Resisitive Index.         Normal cortical thickness of right kidney. No evidence of         right renal artery stenosis. RRV flow present.  Left:  Normal size of left kidney. Normal left Resistive Index.         Normal cortical thickness of the left kidney. No evidence of         left renal artery stenosis. LRV flow present.  Mesenteric:  Normal Celiac artery  and Superior Mesenteric artery findings.   EKG: EKG was personally reviewed. 09/13/2022:  EKG was not ordered. 02/20/2022:  EKG was not ordered. 01/02/2022:  Sinus rhythm. Rate 85 bpm. Left axis deviation. Low voltage. 02/08/21: EKG is sinus rhythm.  Rate 91 bpm.  Low voltage.    Recent Labs: No results found for requested labs within last 365 days.   Recent Lipid Panel No results found for: "CHOL", "TRIG", "HDL", "CHOLHDL", "VLDL", "LDLCALC", "LDLDIRECT"  Physical Exam:    VS:  BP 122/80 (BP Location: Left Arm, Patient Position: Sitting, Cuff Size: Large)   Pulse 89   Ht 5\' 7"  (1.702 m)   Wt (!) 320 lb 0.5 oz (145.2 kg)   SpO2 97%  BMI 50.12 kg/m  , BMI Body mass index is 50.12 kg/m. GENERAL:  Well appearing HEENT: Pupils equal round and reactive, fundi not visualized, oral mucosa unremarkable NECK:  No jugular venous distention, waveform within normal limits, carotid upstroke brisk and symmetric, no bruits LUNGS:  Clear to auscultation bilaterally HEART:  RRR.  PMI not displaced or sustained,S1 and S2 within normal limits, no S3, no S4, no clicks, no rubs, no murmurs ABD:  Flat, positive bowel sounds normal in frequency in pitch, no bruits, no rebound, no guarding, no midline pulsatile mass, no hepatomegaly, no splenomegaly EXT:  2 plus pulses throughout, no edema, no cyanosis no clubbing SKIN:  No rashes no nodules NEURO:  Cranial nerves II through XII grossly intact, motor grossly intact throughout PSYCH:  Cognitively intact, oriented to person place and time   ASSESSMENT/PLAN:    Essential hypertension Blood pressure has been much more controlled. Continue working on diet and exercise. sure to increase her ex/at least 150 minutes weekly.  Continue with amlodipine/valsartan/HCTZ.  Pure hypercholesterolemia She notes that her PCP started rosuvastatin a couple months ago. she does not think that she has plans to have it rechecked.  She will come in for fasting lipids and  CMP.  OSA (obstructive sleep apnea) Working with our team to get a CPAP.  Morbid obesity (HCC) Keep working on weight loss, diet and exercise.    Screening for Secondary Hypertension:     02/08/2021    4:03 PM  Causes  Drugs/Herbals Screened     - Comments limited caffeine.  Renovascular HTN Screened     - Comments check renal Dopplers  Sleep Apnea Screened     - Comments will get sleep study  Thyroid Disease Screened  Hyperaldosteronism N/A  Pheochromocytoma N/A  Cushing's Syndrome N/A  Hyperparathyroidism N/A  Coarctation of the Aorta Screened     - Comments BP symmetric  Compliance Screened     - Comments struggles but she is improving.    Relevant Labs/Studies:    Latest Ref Rng & Units 03/14/2021   11:17 AM  Basic Labs  Sodium 134 - 144 mmol/L 138   Potassium 3.5 - 5.2 mmol/L 3.6   Creatinine 0.57 - 1.00 mg/dL 1.610.82                    03/15/2021   10:09 AM  Renovascular   Renal Artery US Completed Yes     Disposition:  FU with Anniemae Haberkorn C. Duke Salviaandolph, MD, [facility] year.  Medication Adjustments/Labs and Tests Ordered: Current medicines are reviewed at length with the patient today.  Concerns regarding medicines are outlined above.   Orders Placed This Encounter  Procedures   Lipid panel   Comprehensive metabolic panel   No orders of the defined types were placed in this encounter.  I,Mathew Stumpf,acting as a Neurosurgeonscribe for Chilton Siiffany [place], MD.,have documented all relevant documentation on the behalf of Chilton Siiffany [place], MD,as directed by  Chilton Siiffany [place], MD while in the presence of Chilton Siiffany [place], MD.  I, Arleen Bar C. Duke Salviaandolph, MD have reviewed all documentation for this visit.  The documentation of the exam, diagnosis, procedures, and orders on 09/15/2022 are all accurate and complete.  Signed, Chilton Siiffany [place], MD  09/15/2022 9:53 AM    [facility]

## 2022-09-13 NOTE — Assessment & Plan Note (Signed)
Keep working on weight loss, diet and exercise.

## 2022-09-13 NOTE — Assessment & Plan Note (Signed)
Blood pressure has been much more controlled. Continue working on diet and exercise. sure to increase her ex/at least 150 minutes weekly.  Continue with amlodipine/valsartan/HCTZ.

## 2022-09-13 NOTE — Assessment & Plan Note (Signed)
She notes that her PCP started rosuvastatin a couple months ago. she does not think that she has plans to have it rechecked.  She will come in for fasting lipids and CMP.

## 2022-09-15 ENCOUNTER — Encounter (HOSPITAL_BASED_OUTPATIENT_CLINIC_OR_DEPARTMENT_OTHER): Payer: Self-pay | Admitting: Cardiovascular Disease

## 2022-09-22 ENCOUNTER — Ambulatory Visit: Payer: BC Managed Care – PPO | Admitting: Dietician

## 2022-09-24 DIAGNOSIS — F331 Major depressive disorder, recurrent, moderate: Secondary | ICD-10-CM | POA: Diagnosis not present

## 2022-09-24 DIAGNOSIS — Z634 Disappearance and death of family member: Secondary | ICD-10-CM | POA: Diagnosis not present

## 2022-10-03 DIAGNOSIS — G5600 Carpal tunnel syndrome, unspecified upper limb: Secondary | ICD-10-CM | POA: Diagnosis not present

## 2022-10-03 DIAGNOSIS — M25512 Pain in left shoulder: Secondary | ICD-10-CM | POA: Diagnosis not present

## 2022-10-08 DIAGNOSIS — F331 Major depressive disorder, recurrent, moderate: Secondary | ICD-10-CM | POA: Diagnosis not present

## 2022-10-08 DIAGNOSIS — Z634 Disappearance and death of family member: Secondary | ICD-10-CM | POA: Diagnosis not present

## 2022-10-18 ENCOUNTER — Encounter (HOSPITAL_BASED_OUTPATIENT_CLINIC_OR_DEPARTMENT_OTHER): Payer: Self-pay | Admitting: Cardiovascular Disease

## 2022-10-26 ENCOUNTER — Encounter: Payer: Self-pay | Admitting: Dermatology

## 2022-10-26 ENCOUNTER — Ambulatory Visit: Payer: BC Managed Care – PPO | Admitting: Dermatology

## 2022-10-26 DIAGNOSIS — L7211 Pilar cyst: Secondary | ICD-10-CM

## 2022-10-26 DIAGNOSIS — L680 Hirsutism: Secondary | ICD-10-CM | POA: Diagnosis not present

## 2022-10-26 DIAGNOSIS — L814 Other melanin hyperpigmentation: Secondary | ICD-10-CM

## 2022-10-26 MED ORDER — ADAPALENE 0.3 % EX GEL: 1.0000 | Freq: Every day | CUTANEOUS | 2 refills | Status: AC

## 2022-10-26 MED ORDER — HYDROQUINONE 4 % EX CREA: TOPICAL_CREAM | Freq: Every evening | CUTANEOUS | 2 refills | Status: AC

## 2022-10-26 MED ORDER — SPIRONOLACTONE 100 MG PO TABS: 100.0000 mg | ORAL_TABLET | Freq: Every evening | ORAL | 2 refills | Status: DC

## 2022-10-26 NOTE — Patient Instructions (Addendum)
Treatment Plan: Apply pea size amount of Adapalene mixed with Cereve. Apply every other night alternating with Hydroquinone  Apply pea size amount of hydroquinone mixed with Cereve. Apply every other night alternating with Adapalene -Take one tablet of Spironolactone every night with plenty of water.  Spironolactone can cause increased urination and cause blood pressure to decrease. Please watch for signs of lightheadedness and be cautious when changing position. It can sometimes cause breast tenderness or an irregular period in premenopausal women. It can also increase potassium. The increase in potassium usually is not a concern unless you are taking other medicines that also increase potassium, so please be sure your doctor knows all of the other medications you are taking. This medication should not be taken by pregnant women.  This medicine should also not be taken together with sulfa drugs like Bactrim (trimethoprim/sulfamethexazole).   Due to recent changes in healthcare laws, you may see results of your pathology and/or laboratory studies on MyChart before the doctors have had a chance to review them. We understand that in some cases there may be results that are confusing or concerning to you. Please understand that not all results are received at the same time and often the doctors may need to interpret multiple results in order to provide you with the best plan of care or course of treatment. Therefore, we ask that you please give [place] 2 business days to thoroughly review all your results before contacting the office for clarification. Should we see a critical lab result, you will be contacted sooner.   If You Need Anything After Your Visit  If you have any questions or concerns for your doctor, please call our main line at [phone] If no one answers, please leave a voicemail as directed and we will return your call as soon as possible. Messages left after 4 pm will be answered the following  business day.   You may also send [place] a message via MyChart. We typically respond to MyChart messages within 1-2 business days.  For prescription refills, please ask your pharmacy to contact our office. Our fax number is [phone].  If you have an urgent issue when the clinic is closed that cannot wait until the next business day, you can page your doctor at the number below.    Please note that while we do our best to be available for urgent issues outside of office hours, we are not available 24/7.   If you have an urgent issue and are unable to reach [place], you may choose to seek medical care at your doctor's office, retail clinic, urgent care center, or emergency room.  If you have a medical emergency, please immediately call 911 or go to the emergency department. In the event of inclement weather, please call our main line at [phone] for an update on the status of any delays or closures.  Dermatology Medication Tips: Please keep the boxes that topical medications come in in order to help keep track of the instructions about where and how to use these. Pharmacies typically print the medication instructions only on the boxes and not directly on the medication tubes.   If your medication is too expensive, please contact our office at [phone] or send [place] a message through MyChart.   We are unable to tell what your co-pay for medications will be in advance as this is different depending on your insurance coverage. However, we may be able to find a substitute medication at lower cost or fill  out paperwork to get insurance to cover a needed medication.   If a prior authorization is required to get your medication covered by your insurance company, please allow [place] 1-2 business days to complete this process.  Drug prices often vary depending on where the prescription is filled and some pharmacies may offer cheaper prices.  The website www.goodrx.com contains coupons for medications  through different pharmacies. The prices here do not account for what the cost may be with help from insurance (it may be cheaper with your insurance), but the website can give you the price if you did not use any insurance.  - You can print the associated coupon and take it with your prescription to the pharmacy.  - You may also stop by our office during regular business hours and pick up a GoodRx coupon card.  - If you need your prescription sent electronically to a different pharmacy, notify our office through [facility] or by phone at [phone]

## 2022-10-26 NOTE — Progress Notes (Signed)
   New Patient Visit   Subjective  Brittany Mcintyre is a 37 y.o. female who presents for the following: Dark spots under chin and bump in scalp  Patient complains of dark spot. Denies acne or previous trauma to the face to cause spots. Started presenting itself about five years ago. Has not previously seen dermatologist. Has tried vitamin e oil, tumeric face wash which brightened the face as whole but not the dark spots, and a fade cream. Dark spots are in areas where she has unwanted hair. Pt shaves and gets bumps  Patients complains of bump on scalp. Denies pain or drainage. Has been there for about 10 years. Does not get bigger but wants it looked at.    The following portions of the chart were reviewed this encounter and updated as appropriate: medications, allergies, medical history  Review of Systems:  No other skin or systemic complaints except as noted in HPI or Assessment and Plan.  Objective  Well appearing patient in no apparent distress; mood and affect are within normal limits.  A focused examination was performed of the following areas: Face and scalp  Relevant exam findings are noted in the Assessment and Plan.    Assessment & Plan   Hirsutism  Exam: Thicker terminal hairs on the jawline   Treatment Plan: -Spironolactone 100mg  QHS  -Adapalene 0.3 to use every other night after cleansing. Apply pea size amount with moisturizer and apply to face.  The following medication counseling was provided: Spironolactone Counseling: Patient advised regarding risks of diarrhea, abdominal pain, hyperkalemia, birth defects (for female patients), liver toxicity and renal toxicity. The patient may need blood work to monitor liver and kidney function and potassium levels while on therapy. The patient verbalized understanding of the proper use and possible adverse effects of spironolactone. All of the patient's questions and concerns were addressed.  Discussed laser hair removal  treatment is the better treatment to remove unwanted hair.   Hyperpigmentation Exam: brown macules of areas of prior razor bumps  Treatment Plan: -Hydroquinone 4% to apply pea size amount mixed with Cereve and apply every other night. Alternating with Adapalene  -Discussed SPF Sunscreen use    Pilar Cyst Exam: 3cm mobile subcutaneous nodule on the vertex of the scalp  Treatment: Benign-appearing.  Discussed that a cyst is a benign growth that can grow over time and sometimes get irritated or inflamed. Recommend observation if it is not bothersome. Discussed option of surgical excision to remove it if it is growing, symptomatic, or other changes noted. Please call for new or changing lesions so they can be evaluated.    Return in about 3 months (around 01/26/2023) for Hyperpigmentation f/u.    Documentation: I have reviewed the above documentation for accuracy and completeness, and I agree with the above.  Langston Reusing, DO  I, Germaine Pomfret, CMA, am acting as scribe for [employer], DO.

## 2022-11-03 DIAGNOSIS — H5210 Myopia, unspecified eye: Secondary | ICD-10-CM | POA: Diagnosis not present

## 2022-11-03 DIAGNOSIS — H52209 Unspecified astigmatism, unspecified eye: Secondary | ICD-10-CM | POA: Diagnosis not present

## 2022-11-13 ENCOUNTER — Encounter (HOSPITAL_BASED_OUTPATIENT_CLINIC_OR_DEPARTMENT_OTHER): Payer: Self-pay | Admitting: Cardiovascular Disease

## 2022-11-13 MED ORDER — AMLODIPINE-VALSARTAN-HCTZ 10-160-25 MG PO TABS: 1.0000 | ORAL_TABLET | Freq: Every day | ORAL | 3 refills | Status: DC

## 2022-11-14 ENCOUNTER — Other Ambulatory Visit (HOSPITAL_BASED_OUTPATIENT_CLINIC_OR_DEPARTMENT_OTHER): Payer: Self-pay | Admitting: Family

## 2022-11-15 MED ORDER — AMLODIPINE-VALSARTAN-HCTZ 5-160-12.5 MG PO TABS: 1.0000 | ORAL_TABLET | Freq: Every day | ORAL | 3 refills | Status: DC

## 2022-11-15 NOTE — Addendum Note (Signed)
Addended by: Regis Bill B on: 11/15/2022 03:18 PM   Modules accepted: Orders

## 2022-11-15 NOTE — Telephone Encounter (Signed)
Called [facility] Golden and the Amlodipine-Valsartan-HCTZ 10-160-25 mg tablet was filled incorrectly at 5-160-12.5 mg daily in February  Discussed with Dr Cristal Deer and will have patient continue lower dose patient has been taking since February, continue to monitor blood pressure at home. Call if any changes

## 2022-11-21 ENCOUNTER — Other Ambulatory Visit: Payer: Self-pay | Admitting: Obstetrics and Gynecology

## 2022-11-21 DIAGNOSIS — E049 Nontoxic goiter, unspecified: Secondary | ICD-10-CM

## 2022-11-23 DIAGNOSIS — G4733 Obstructive sleep apnea (adult) (pediatric): Secondary | ICD-10-CM | POA: Diagnosis not present

## 2022-11-26 DIAGNOSIS — F331 Major depressive disorder, recurrent, moderate: Secondary | ICD-10-CM | POA: Diagnosis not present

## 2022-11-26 DIAGNOSIS — Z634 Disappearance and death of family member: Secondary | ICD-10-CM | POA: Diagnosis not present

## 2022-12-01 ENCOUNTER — Ambulatory Visit
Admission: RE | Admit: 2022-12-01 | Discharge: 2022-12-01 | Disposition: A | Discharge status: Home / Self Care | Payer: BC Managed Care – PPO | Source: Ambulatory Visit | Attending: Obstetrics and Gynecology | Admitting: Obstetrics and Gynecology

## 2022-12-01 DIAGNOSIS — E049 Nontoxic goiter, unspecified: Secondary | ICD-10-CM

## 2022-12-01 DIAGNOSIS — E041 Nontoxic single thyroid nodule: Secondary | ICD-10-CM | POA: Diagnosis not present

## 2022-12-01 DIAGNOSIS — G4733 Obstructive sleep apnea (adult) (pediatric): Secondary | ICD-10-CM | POA: Diagnosis not present

## 2022-12-17 DIAGNOSIS — F331 Major depressive disorder, recurrent, moderate: Secondary | ICD-10-CM | POA: Diagnosis not present

## 2022-12-17 DIAGNOSIS — Z634 Disappearance and death of family member: Secondary | ICD-10-CM | POA: Diagnosis not present

## 2023-01-07 DIAGNOSIS — Z634 Disappearance and death of family member: Secondary | ICD-10-CM | POA: Diagnosis not present

## 2023-01-07 DIAGNOSIS — F331 Major depressive disorder, recurrent, moderate: Secondary | ICD-10-CM | POA: Diagnosis not present

## 2023-01-10 ENCOUNTER — Other Ambulatory Visit: Payer: Self-pay

## 2023-01-10 ENCOUNTER — Emergency Department (HOSPITAL_COMMUNITY): Payer: BC Managed Care – PPO

## 2023-01-10 ENCOUNTER — Encounter (HOSPITAL_COMMUNITY): Payer: Self-pay

## 2023-01-10 ENCOUNTER — Emergency Department (HOSPITAL_COMMUNITY)
Admission: EM | Admit: 2023-01-10 | Discharge: 2023-01-10 | Disposition: A | Discharge status: Home / Self Care | Payer: BC Managed Care – PPO | Attending: Emergency Medicine | Admitting: Emergency Medicine

## 2023-01-10 DIAGNOSIS — S93601A Unspecified sprain of right foot, initial encounter: Secondary | ICD-10-CM | POA: Diagnosis not present

## 2023-01-10 DIAGNOSIS — Y92219 Unspecified school as the place of occurrence of the external cause: Secondary | ICD-10-CM | POA: Insufficient documentation

## 2023-01-10 DIAGNOSIS — X501XXA Overexertion from prolonged static or awkward postures, initial encounter: Secondary | ICD-10-CM | POA: Insufficient documentation

## 2023-01-10 DIAGNOSIS — Y9301 Activity, walking, marching and hiking: Secondary | ICD-10-CM | POA: Diagnosis not present

## 2023-01-10 DIAGNOSIS — S99921A Unspecified injury of right foot, initial encounter: Secondary | ICD-10-CM | POA: Diagnosis not present

## 2023-01-10 DIAGNOSIS — M79671 Pain in right foot: Secondary | ICD-10-CM | POA: Diagnosis not present

## 2023-01-10 NOTE — Discharge Instructions (Addendum)
X-rays are normal. Tylenol/ibuprofen as needed for pain.

## 2023-01-10 NOTE — ED Triage Notes (Signed)
Pt rolled right foot yesterday and heard a crack. Pt is able to bear weight. Previous injury to foot in high school.

## 2023-01-10 NOTE — ED Provider Notes (Signed)
[place] EMERGENCY DEPARTMENT AT [facility] Provider Note   CSN: [id number] Arrival date & time: 01/10/23  1154     History  Chief Complaint  Patient presents with   Foot Pain    Brittany Mcintyre is a 37 y.o. female.  Patient 37 year old female presenting today with ongoing pain in her right foot after she twisted it while walking down the stairs yesterday.  She reports in high school she did break her foot and had to wear a cast for 2 months but otherwise has had no other issues.  She is able to walk on it but it is tender.  She denies any ankle pain.  No swelling noted.  The history is provided by the patient.  Foot Pain       Home Medications Prior to Admission medications   Medication Sig Start Date End Date Taking? Authorizing Provider  Adapalene (DIFFERIN) 0.3 % gel Apply 1 Application topically at bedtime. Apply pea size amount mixed with Cereve every other night. Alternate with Hydroquinone 10/26/22   Terri Piedra, DO  amLODIPine-Valsartan-HCTZ 5-160-12.5 MG TABS Take 1 tablet by mouth daily. 11/15/22   Chilton Si, MD  cetirizine (ZYRTEC) 10 MG tablet Take 10 mg by mouth daily as needed for allergies.    [provider]  hydroquinone 4 % cream Apply topically at bedtime. Apply pea size amount mixed with Cereve every other night. Alternate with Adapalene 10/26/22   Terri Piedra, DO  Multiple Vitamins-Minerals (MULTIVITAMIN ADULT PO) Take 1 tablet by mouth daily.    [provider]  rosuvastatin (CRESTOR) 5 MG tablet Take 5 mg by mouth daily.    [provider]  spironolactone (ALDACTONE) 100 MG tablet Take 1 tablet (100 mg total) by mouth at bedtime. 10/26/22   Terri Piedra, DO      Allergies    Shellfish allergy    Review of Systems   Review of Systems  Physical Exam Updated Vital Signs BP 139/87 (BP Location: Left Arm)   Pulse 100   Temp 98.5 F (36.9 C) (Oral)   Resp 18   Ht 5\' 7"  (1.702 m)   Wt  (!) 142.9 kg   LMP 01/10/2023   SpO2 98%   BMI 49.34 kg/m  Physical Exam Vitals and nursing note reviewed.  Constitutional:      Appearance: Normal appearance.  Musculoskeletal:     Right ankle: Normal.     Right foot: Tenderness present.       Legs:     Comments: Tenderness to the base of the 5th metatarsal  Neurological:     Mental Status: She is alert. Mental status is at baseline.     ED Results / Procedures / Treatments   Labs (all labs ordered are listed, but only abnormal results are displayed) Labs Reviewed - No data to display  EKG None  Radiology No results found.  Procedures Procedures    Medications Ordered in ED Medications - No data to display  ED Course/ Medical Decision Making/ A&P                                 Medical Decision Making Amount and/or Complexity of Data Reviewed Radiology: ordered and independent interpretation performed. Decision-making details documented in ED Course.   Patient presenting today with foot pain after twisting her foot while walking down the stairs.  Will ensure no fractures but no significant  swelling noted.  I have independently visualized and interpreted pt's images today.  Neg right foot. Will give f/u prn and pt stable for dc/        Final Clinical Impression(s) / ED Diagnoses Final diagnoses:  Sprain of right foot, initial encounter    Rx / DC Orders ED Discharge Orders     None         Gwyneth Sprout, MD 01/10/23 1431

## 2023-01-16 ENCOUNTER — Other Ambulatory Visit: Payer: Self-pay | Admitting: Dermatology

## 2023-01-17 DIAGNOSIS — R7303 Prediabetes: Secondary | ICD-10-CM | POA: Diagnosis not present

## 2023-01-29 DIAGNOSIS — R7303 Prediabetes: Secondary | ICD-10-CM | POA: Diagnosis not present

## 2023-02-06 ENCOUNTER — Ambulatory Visit: Payer: BC Managed Care – PPO | Admitting: Dermatology

## 2023-02-11 DIAGNOSIS — F331 Major depressive disorder, recurrent, moderate: Secondary | ICD-10-CM | POA: Diagnosis not present

## 2023-02-11 DIAGNOSIS — Z634 Disappearance and death of family member: Secondary | ICD-10-CM | POA: Diagnosis not present

## 2023-02-11 DIAGNOSIS — F411 Generalized anxiety disorder: Secondary | ICD-10-CM | POA: Diagnosis not present

## 2023-03-04 DIAGNOSIS — Z634 Disappearance and death of family member: Secondary | ICD-10-CM | POA: Diagnosis not present

## 2023-03-04 DIAGNOSIS — F331 Major depressive disorder, recurrent, moderate: Secondary | ICD-10-CM | POA: Diagnosis not present

## 2023-03-05 ENCOUNTER — Encounter: Payer: Self-pay | Admitting: Dermatology

## 2023-03-05 ENCOUNTER — Ambulatory Visit: Payer: BC Managed Care – PPO | Admitting: Dermatology

## 2023-03-05 VITALS — BP 100/65 | HR 85

## 2023-03-05 DIAGNOSIS — L680 Hirsutism: Secondary | ICD-10-CM

## 2023-03-05 DIAGNOSIS — L7211 Pilar cyst: Secondary | ICD-10-CM | POA: Diagnosis not present

## 2023-03-05 DIAGNOSIS — L814 Other melanin hyperpigmentation: Secondary | ICD-10-CM

## 2023-03-05 DIAGNOSIS — L729 Follicular cyst of the skin and subcutaneous tissue, unspecified: Secondary | ICD-10-CM

## 2023-03-05 MED ORDER — SAFETY SEAL MISCELLANEOUS MISC: 1.0000 | Freq: Every day | 2 refills | Status: DC

## 2023-03-05 MED ORDER — SPIRONOLACTONE 100 MG PO TABS
200.0000 mg | ORAL_TABLET | Freq: Every day | ORAL | 2 refills | Status: DC
Start: 2023-03-05 — End: 2023-08-29

## 2023-03-05 NOTE — Patient Instructions (Addendum)
Hello Brittany Mcintyre,  Thank you for visiting [place] today. We are encouraged by your dedication to enhancing your health and are delighted to observe the progress you've achieved.  Here is a summary of the key instructions from today's consultation:  - Medications and Skin Care:   - Adapalene: Continue application every other night.   - Hydroquinone: Discontinue use and switch to a new compounded topical cream.     - New Cream: Contains tranexamic acid, kojic acid, and niacinamide, available from [facility].   - Spironolactone: Increase dosage to 200 mg daily for hirsutism. Remember to maintain adequate hydration.  - Lifestyle and Care:   - Laser Hair Removal: Consider undergoing treatment with ND YAG laser, which typically requires 6-9 sessions. Further details will be provided in your after-visit summary.   - Scalp Cyst: If opting for excision, avoid NSAIDs, alcohol, and blood-thinning supplements 24 hours before the procedure. Ensure your hair is clean before the procedure.  - Follow-Up:   - Appointment: Schedule a follow-up in three months to evaluate progress and take additional photographs.   - Cyst Removal: If you decide on this option, a consultation with Dr. Caralyn Guile can be arranged.  - Pharmacy and Payments:   - Lightening Cream: Prepared by [facility], who will reach out for payment and shipping information.  We are grateful for the trust you've placed in [place] for your care. Your progress is commendable, and we eagerly anticipate your continued improvement.  Warm regards,  Dr. [facility]     Pre-Operative Instructions  You are scheduled for a surgical procedure at [facility]. We recommend you read the following instructions. If you have any questions or concerns, please call the office at [phone]  Shower and wash the entire body with soap and water the day of your surgery paying special attention to cleansing at and around the planned  surgery site.  Avoid aspirin or aspirin containing products at least fourteen (14) days prior to your surgical procedure and for at least one week (7 Days) after your surgical procedure. If you take aspirin on a regular basis for heart disease or history of stroke or for any other reason, we may recommend you continue taking aspirin but please notify us if you take this on a regular basis. Aspirin can cause more bleeding to occur during surgery as well as prolonged bleeding and bruising after surgery.   Avoid other nonsteroidal pain medications at least one week prior to surgery and at least one week prior to your surgery. These include medications such as Ibuprofen (Motrin, Advil and Nuprin), Naprosyn, Voltaren, Relafen, etc. If medications are used for therapeutic reasons, please inform us as they can cause increased bleeding or prolonged bleeding during and bruising after surgical procedures.   Please advise [place] if you are taking any "blood thinner" medications such as Coumadin or Dipyridamole or Plavix or similar medications. These cause increased bleeding and prolonged bleeding during procedures and bruising after surgical procedures. We may have to consider discontinuing these medications briefly prior to and shortly after your surgery if safe to do so.   Please inform us of all medications you are currently taking. All medications that are taken regularly should be taken the day of surgery as you always do. Nevertheless, we need to be informed of what medications you are taking prior to surgery to know whether they will affect the procedure or cause any complications.   Please inform us of any medication allergies. Also inform us  of whether you have allergies to Latex or rubber products or whether you have had any adverse reaction to Lidocaine or Epinephrine.  Please inform us of any prosthetic or artificial body parts such as artificial heart valve, joint replacements, etc., or similar condition  that might require preoperative antibiotics.   We recommend avoidance of alcohol at least two weeks prior to surgery and continued avoidance for at least two weeks after surgery.   We recommend discontinuation of tobacco smoking at least two weeks prior to surgery and continued abstinence for at least two weeks after surgery.  Do not plan strenuous exercise, strenuous work or strenuous lifting for approximately four weeks after your surgery.   We request if you are unable to make your scheduled surgical appointment, please call us at least a week in advance or as soon as you are aware of a problem so that we can cancel or reschedule the appointment.   You MAY TAKE TYLENOL (acetaminophen) for pain as it is not a blood thinner.   PLEASE PLAN TO BE IN TOWN FOR TWO WEEKS FOLLOWING SURGERY, THIS IS IMPORTANT SO YOU CAN BE CHECKED FOR DRESSING CHANGES, SUTURE REMOVAL AND TO MONITOR FOR POSSIBLE COMPLICATIONS.     Important Information  Due to recent changes in healthcare laws, you may see results of your pathology and/or laboratory studies on MyChart before the doctors have had a chance to review them. We understand that in some cases there may be results that are confusing or concerning to you. Please understand that not all results are received at the same time and often the doctors may need to interpret multiple results in order to provide you with the best plan of care or course of treatment. Therefore, we ask that you please give [place] 2 business days to thoroughly review all your results before contacting the office for clarification. Should we see a critical lab result, you will be contacted sooner.   If You Need Anything After Your Visit  If you have any questions or concerns for your doctor, please call our main line at [phone] If no one answers, please leave a voicemail as directed and we will return your call as soon as possible. Messages left after 4 pm will be answered the following  business day.   You may also send [place] a message via MyChart. We typically respond to MyChart messages within 1-2 business days.  For prescription refills, please ask your pharmacy to contact our office. Our fax number is [phone].  If you have an urgent issue when the clinic is closed that cannot wait until the next business day, you can page your doctor at the number below.    Please note that while we do our best to be available for urgent issues outside of office hours, we are not available 24/7.   If you have an urgent issue and are unable to reach [place], you may choose to seek medical care at your doctor's office, retail clinic, urgent care center, or emergency room.  If you have a medical emergency, please immediately call 911 or go to the emergency department. In the event of inclement weather, please call our main line at [phone] for an update on the status of any delays or closures.  Dermatology Medication Tips: Please keep the boxes that topical medications come in in order to help keep track of the instructions about where and how to use these. Pharmacies typically print the medication instructions only on the boxes and not  directly on the medication tubes.   If your medication is too expensive, please contact our office at [phone] or send [place] a message through MyChart.   We are unable to tell what your co-pay for medications will be in advance as this is different depending on your insurance coverage. However, we may be able to find a substitute medication at lower cost or fill out paperwork to get insurance to cover a needed medication.   If a prior authorization is required to get your medication covered by your insurance company, please allow [place] 1-2 business days to complete this process.  Drug prices often vary depending on where the prescription is filled and some pharmacies may offer cheaper prices.  The website www.goodrx.com contains coupons for medications  through different pharmacies. The prices here do not account for what the cost may be with help from insurance (it may be cheaper with your insurance), but the website can give you the price if you did not use any insurance.  - You can print the associated coupon and take it with your prescription to the pharmacy.  - You may also stop by our office during regular business hours and pick up a GoodRx coupon card.  - If you need your prescription sent electronically to a different pharmacy, notify our office through [facility] or by phone at [phone]

## 2023-03-05 NOTE — Progress Notes (Signed)
   Follow-Up Visit   Subjective  Brittany Mcintyre is a 37 y.o. female who presents for the following: patient here for 3 month follow up on hyperpigmentation, has been using hydroquinone cream and moisturizers. Does see a small amount of difference. Patient also follow up on hirsutism at chin. Taking spironolactone and using adapalene 0.3 % . Would like to discuss other options for treatment if any.  Would also like to discuss pilar cyst at scalp, doesn't bother but would like discuss options to have removed.   The following portions of the chart were reviewed this encounter and updated as appropriate: medications, allergies, medical history  Review of Systems:  No other skin or systemic complaints except as noted in HPI or Assessment and Plan.  Objective  Well appearing patient in no apparent distress; mood and affect are within normal limits.   A focused examination was performed of the following areas: Face, chin, scalp   Relevant exam findings are noted in the Assessment and Plan.           Assessment & Plan   Hirsutism  Exam: Thicker terminal hairs on the jawline    Treatment Plan: - increase Spironolactone from 100mg  to 200mg  qhs  - Continue Adapalene 0.3 to use every other night after cleansing. Apply pea size amount with moisturizer and apply to face.  Discussed laser hair removal with ND Lag Laser as permanent option for removing hair Recommend 6 - 9 sessions  Maintenance  -  2 - 3 years may need touch up   The following medication counseling was provided: Spironolactone Counseling: Patient advised regarding risks of diarrhea, abdominal pain, hyperkalemia, birth defects (for female patients), liver toxicity and renal toxicity. The patient may need blood work to monitor liver and kidney function and potassium levels while on therapy. The patient verbalized understanding of the proper use and possible adverse effects of spironolactone. All of the patient's questions  and concerns were addressed.   Hyperpigmentation Exam: brown macules of areas of prior razor bumps   Treatment Plan: -Hold Hydroquinone 4% to apply pea size amount mixed with Cereve and apply every other night.   Start Melaxemic Cream - apply pea sized amount topically to aa's qhs  Rx sent to [facility]  Continue Alternating with Adapalene   - Continue SPF Sunscreen    Hirsutism  Related Medications spironolactone (ALDACTONE) 100 MG tablet Take 2 tablets (200 mg total) by mouth at bedtime.  Other melanin hyperpigmentation  Related Medications Safety Seal Miscellaneous MISC Apply 1 Application topically at bedtime. Medication name : Melaxemic Cream    Pilar Cyst Exam: 3cm mobile non tender subcutaneous nodule on the vertex of the scalp   Treatment: Benign-appearing.   Patient is bothered and would like removed.  Cyst with symptoms and/or recent change.  Discussed surgical excision to remove, including resulting scar and possible recurrence.  Patient will schedule for surgery. Pre-op information given.  3 month follow up    Return in about 3 months (around 06/04/2023) for Hyperpigmentation followup.  I, Asher Muir, CMA, am acting as scribe for [employer], DO.   Documentation: I have reviewed the above documentation for accuracy and completeness, and I agree with the above.  Langston Reusing, DO

## 2023-03-18 DIAGNOSIS — Z634 Disappearance and death of family member: Secondary | ICD-10-CM | POA: Diagnosis not present

## 2023-03-18 DIAGNOSIS — F331 Major depressive disorder, recurrent, moderate: Secondary | ICD-10-CM | POA: Diagnosis not present

## 2023-03-21 DIAGNOSIS — Z114 Encounter for screening for human immunodeficiency virus [HIV]: Secondary | ICD-10-CM | POA: Diagnosis not present

## 2023-03-21 DIAGNOSIS — Z113 Encounter for screening for infections with a predominantly sexual mode of transmission: Secondary | ICD-10-CM | POA: Diagnosis not present

## 2023-04-03 DIAGNOSIS — Z114 Encounter for screening for human immunodeficiency virus [HIV]: Secondary | ICD-10-CM | POA: Diagnosis not present

## 2023-04-03 DIAGNOSIS — Z113 Encounter for screening for infections with a predominantly sexual mode of transmission: Secondary | ICD-10-CM | POA: Diagnosis not present

## 2023-04-08 DIAGNOSIS — Z634 Disappearance and death of family member: Secondary | ICD-10-CM | POA: Diagnosis not present

## 2023-04-08 DIAGNOSIS — F331 Major depressive disorder, recurrent, moderate: Secondary | ICD-10-CM | POA: Diagnosis not present

## 2023-04-27 ENCOUNTER — Other Ambulatory Visit: Payer: Self-pay

## 2023-04-27 ENCOUNTER — Emergency Department (HOSPITAL_COMMUNITY): Payer: BC Managed Care – PPO

## 2023-04-27 ENCOUNTER — Emergency Department (HOSPITAL_COMMUNITY)
Admission: EM | Admit: 2023-04-27 | Discharge: 2023-04-27 | Disposition: A | Discharge status: Home / Self Care | Payer: BC Managed Care – PPO | Attending: Emergency Medicine | Admitting: Emergency Medicine

## 2023-04-27 ENCOUNTER — Encounter (HOSPITAL_COMMUNITY): Payer: Self-pay

## 2023-04-27 DIAGNOSIS — K449 Diaphragmatic hernia without obstruction or gangrene: Secondary | ICD-10-CM | POA: Diagnosis not present

## 2023-04-27 DIAGNOSIS — R110 Nausea: Secondary | ICD-10-CM | POA: Insufficient documentation

## 2023-04-27 DIAGNOSIS — I10 Essential (primary) hypertension: Secondary | ICD-10-CM | POA: Insufficient documentation

## 2023-04-27 DIAGNOSIS — R109 Unspecified abdominal pain: Secondary | ICD-10-CM | POA: Insufficient documentation

## 2023-04-27 DIAGNOSIS — Z79899 Other long term (current) drug therapy: Secondary | ICD-10-CM | POA: Diagnosis not present

## 2023-04-27 LAB — CBC
HCT: 39.5 % (ref 36.0–46.0)
Hemoglobin: 13.1 g/dL (ref 12.0–15.0)
MCH: 29.1 pg (ref 26.0–34.0)
MCHC: 33.2 g/dL (ref 30.0–36.0)
MCV: 87.8 fL (ref 80.0–100.0)
Platelets: 368 10*3/uL (ref 150–400)
RBC: 4.5 MIL/uL (ref 3.87–5.11)
RDW: 12.7 % (ref 11.5–15.5)
WBC: 7.6 10*3/uL (ref 4.0–10.5)
nRBC: 0 % (ref 0.0–0.2)

## 2023-04-27 LAB — URINALYSIS, ROUTINE W REFLEX MICROSCOPIC
Bilirubin Urine: NEGATIVE
Glucose, UA: NEGATIVE mg/dL
Hgb urine dipstick: NEGATIVE
Ketones, ur: NEGATIVE mg/dL
Leukocytes,Ua: NEGATIVE
Nitrite: NEGATIVE
Protein, ur: NEGATIVE mg/dL
Specific Gravity, Urine: 1.017 (ref 1.005–1.030)
pH: 6 (ref 5.0–8.0)

## 2023-04-27 LAB — BASIC METABOLIC PANEL
Anion gap: 11 (ref 5–15)
BUN: 19 mg/dL (ref 6–20)
CO2: 25 mmol/L (ref 22–32)
Calcium: 9.6 mg/dL (ref 8.9–10.3)
Chloride: 101 mmol/L (ref 98–111)
Creatinine, Ser: 1.05 mg/dL — ABNORMAL HIGH (ref 0.44–1.00)
GFR, Estimated: >60 mL/min (ref >60)
Glucose, Bld: 127 mg/dL — ABNORMAL HIGH (ref 70–99)
Potassium: 3.9 mmol/L (ref 3.5–5.1)
Sodium: 137 mmol/L (ref 135–145)

## 2023-04-27 LAB — HCG, SERUM, QUALITATIVE: Preg, Serum: NEGATIVE

## 2023-04-27 NOTE — Discharge Instructions (Signed)
You are seen in the emergency room today for flank pain, imaging and lab work were reassuring. I would recommend alternating Tylenol and ibuprofen for pain control which can be bought over-the-counter.  You can also try ice and heat over the area.  Please follow-up with primary care to ensure resolution of symptoms return to emergency room with new or worsening symptoms.

## 2023-04-27 NOTE — ED Provider Notes (Signed)
[facility] EMERGENCY DEPARTMENT AT [facility] Provider Note   CSN: [id number] Arrival date & time: 04/27/23  1452     History {Add pertinent medical, surgical, social history, OB history to HPI:1} Chief Complaint  Patient presents with   Flank Pain    Annalynn Kable is a 37 y.o. female patient with past medical history of hypertension, hyperlipidemia presenting to emergency room with 3 days of right-sided flank pain associate with nausea.  Patient reports pain is intermittent, comes and goes.  Patient is tolerating p.o. intake today.  No recent injuries traumas or falls. Denies any vomiting she likely lives in the room.  Denies vomiting or diarrhea, no fevers or chills.   Flank Pain       Home Medications Prior to Admission medications   Medication Sig Start Date End Date Taking? Authorizing Provider  Adapalene (DIFFERIN) 0.3 % gel Apply 1 Application topically at bedtime. Apply pea size amount mixed with Cereve every other night. Alternate with Hydroquinone 10/26/22   Terri Piedra, DO  amLODIPine-Valsartan-HCTZ 5-160-12.5 MG TABS Take 1 tablet by mouth daily. 11/15/22   Chilton Si, MD  cetirizine (ZYRTEC) 10 MG tablet Take 10 mg by mouth daily as needed for allergies.    [provider]  hydroquinone 4 % cream Apply topically at bedtime. Apply pea size amount mixed with Cereve every other night. Alternate with Adapalene 10/26/22   Terri Piedra, DO  Multiple Vitamins-Minerals (MULTIVITAMIN ADULT PO) Take 1 tablet by mouth daily.    [provider]  rosuvastatin (CRESTOR) 5 MG tablet Take 5 mg by mouth daily.    [provider]  Safety Seal Miscellaneous MISC Apply 1 Application topically at bedtime. Medication name : Melaxemic Cream 03/05/23   Terri Piedra, DO  spironolactone (ALDACTONE) 100 MG tablet Take 2 tablets (200 mg total) by mouth at bedtime. 03/05/23   Terri Piedra, DO      Allergies    Shellfish allergy     Review of Systems   Review of Systems  Genitourinary:  Positive for flank pain.    Physical Exam Updated Vital Signs BP 105/81   Pulse 97   Temp 99.4 F (37.4 C) (Oral)   Resp 16   Ht 5\' 7"  (1.702 m)   Wt (!) 146.5 kg   LMP 03/20/2023   SpO2 93%   BMI 50.59 kg/m  Physical Exam Vitals and nursing note reviewed.  Constitutional:      General: She is not in acute distress.    Appearance: She is not toxic-appearing.  HENT:     Head: Normocephalic and atraumatic.  Eyes:     General: No scleral icterus.    Conjunctiva/sclera: Conjunctivae normal.  Cardiovascular:     Rate and Rhythm: Normal rate and regular rhythm.     Pulses: Normal pulses.     Heart sounds: Normal heart sounds.  Pulmonary:     Effort: Pulmonary effort is normal. No respiratory distress.     Breath sounds: Normal breath sounds.  Abdominal:     General: Abdomen is flat. Bowel sounds are normal.     Palpations: Abdomen is soft.     Tenderness: There is abdominal tenderness. There is no right CVA tenderness or left CVA tenderness.     Comments: Right flank pain No rash or bruising over area  Musculoskeletal:     Right lower leg: No edema.     Left lower leg: No edema.  Skin:    General:  Skin is warm and dry.     Findings: No lesion.  Neurological:     General: No focal deficit present.     Mental Status: She is alert and oriented to person, place, and time. Mental status is at baseline.     ED Results / Procedures / Treatments   Labs (all labs ordered are listed, but only abnormal results are displayed) Labs Reviewed  BASIC METABOLIC PANEL - Abnormal; Notable for the following components:      Result Value   Glucose, Bld 127 (*)    Creatinine, Ser 1.05 (*)    All other components within normal limits  URINALYSIS, ROUTINE W REFLEX MICROSCOPIC  HCG, SERUM, QUALITATIVE  CBC    EKG None  Radiology No results found.  Procedures Procedures  {Document cardiac monitor, telemetry  assessment procedure when appropriate:1}  Medications Ordered in ED Medications - No data to display  ED Course/ Medical Decision Making/ A&P   {   Click here for ABCD2, HEART and other calculatorsREFRESH Note before signing :1}                              Medical Decision Making Amount and/or Complexity of Data Reviewed Labs: ordered. Radiology: ordered.   Danniella Tweet 37 y.o. presented today for abd pain. Working DDx includes, but not limited to, gastroenteritis, colitis, SBO, appendicitis, cholecystitis, hepatobiliary pathology, gastritis, PUD, ACS, dissection, pancreatitis, nephrolithiasis, AAA, UTI, pyelonephritis, ruptured ectopic pregnancy, PID, ovarian  R/o DDx: These are considered less likely than current impression due to history of present illness, physical exam, labs/imaging findings.  Review of prior external notes: None  Pmhx: None  Unique Tests and My Interpretation:  CBC: No leukocytosis, hemoglobin 13.1 BMP: No electrolyte maladies, glucose 127 hCG negative UA without nitrites, no leukocytes   Imaging:  CT Renal Study: due to favored nephrolithiasis over GI etiology for patient's abdominal pain - no acute abdominal pathology   Problem List / ED Course / Critical interventions / Medication management  *** Declined medications, not currently nausea, pain under control  Patients vitals assessed. Upon arrival patient is hemodynamically stable.  I have reviewed the patients home medicines and have made adjustments as needed    Consult: None  Plan:  F/u w/ PCP in 2-3d to ensure resolution of sx.  Patient was given return precautions. Patient stable for discharge at this time.  Patient educated on current sx/dx and verbalized understanding of plan. Return to ER w/ new or worsening sx.    {Document critical care time when appropriate:1} {Document review of labs and clinical decision tools ie heart score, Chads2Vasc2 etc:1}  {Document your  independent review of radiology images, and any outside records:1} {Document your discussion with family members, caretakers, and with consultants:1} {Document social determinants of health affecting pt's care:1} {Document your decision making why or why not admission, treatments were needed:1} Final Clinical Impression(s) / ED Diagnoses Final diagnoses:  None    Rx / DC Orders ED Discharge Orders     None

## 2023-04-27 NOTE — ED Triage Notes (Signed)
Pt complaining of intermittent right sided flank pain that has been present for 3x days. Pt does endorse some nausea, but denies any emesis, diarrhea, or changes to urinating.

## 2023-05-13 DIAGNOSIS — F331 Major depressive disorder, recurrent, moderate: Secondary | ICD-10-CM | POA: Diagnosis not present

## 2023-05-13 DIAGNOSIS — Z634 Disappearance and death of family member: Secondary | ICD-10-CM | POA: Diagnosis not present

## 2023-06-04 ENCOUNTER — Ambulatory Visit: Payer: BC Managed Care – PPO | Admitting: Dermatology

## 2023-06-17 DIAGNOSIS — Z634 Disappearance and death of family member: Secondary | ICD-10-CM | POA: Diagnosis not present

## 2023-06-17 DIAGNOSIS — F331 Major depressive disorder, recurrent, moderate: Secondary | ICD-10-CM | POA: Diagnosis not present

## 2023-07-08 DIAGNOSIS — Z634 Disappearance and death of family member: Secondary | ICD-10-CM | POA: Diagnosis not present

## 2023-07-08 DIAGNOSIS — F331 Major depressive disorder, recurrent, moderate: Secondary | ICD-10-CM | POA: Diagnosis not present

## 2023-07-10 ENCOUNTER — Encounter: Payer: Self-pay | Admitting: Dermatology

## 2023-07-10 ENCOUNTER — Ambulatory Visit: Payer: BC Managed Care – PPO | Admitting: Dermatology

## 2023-07-10 VITALS — BP 98/66 | HR 77

## 2023-07-10 DIAGNOSIS — L680 Hirsutism: Secondary | ICD-10-CM

## 2023-07-10 DIAGNOSIS — Z6841 Body Mass Index (BMI) 40.0 and over, adult: Secondary | ICD-10-CM | POA: Insufficient documentation

## 2023-07-10 DIAGNOSIS — L819 Disorder of pigmentation, unspecified: Secondary | ICD-10-CM

## 2023-07-10 DIAGNOSIS — N951 Menopausal and female climacteric states: Secondary | ICD-10-CM | POA: Insufficient documentation

## 2023-07-10 DIAGNOSIS — E041 Nontoxic single thyroid nodule: Secondary | ICD-10-CM | POA: Insufficient documentation

## 2023-07-10 DIAGNOSIS — L814 Other melanin hyperpigmentation: Secondary | ICD-10-CM

## 2023-07-10 DIAGNOSIS — E88819 Insulin resistance, unspecified: Secondary | ICD-10-CM | POA: Insufficient documentation

## 2023-07-10 NOTE — Progress Notes (Signed)
   Follow-Up Visit   Subjective  Brittany Mcintyre is a 38 y.o. female who presents for the following: Hirsutism, PIH, Cyst  Patient present today for follow up visit for Hirsutism and PIH. Patient was last evaluated on 03/05/23.At this visit pt was recommended to start Melazmic Cream and Spironolactone  daily. She does apply Melazmic Cream alternating with Adapelene and she cut back to 1 dose of Spironolactone  instead of 2 tablets. Patient reports sxs are  improving but not at goal . Patient denies medication changes.  The following portions of the chart were reviewed this encounter and updated as appropriate: medications, allergies, medical history  Review of Systems:  No other skin or systemic complaints except as noted in HPI or Assessment and Plan.  Objective  Well appearing patient in no apparent distress; mood and affect are within normal limits.  A focused examination was performed of the following areas: Chin and Neck  Relevant exam findings are noted in the Assessment and Plan.           Assessment & Plan   Hirsutism  Exam: Thicker terminal hairs on the jawline    Treatment Plan: - Continue Spironolactone  100 mg at bedtime  - Continue Adapalene  0.3 to use nightly  with Melazmic after cleansing. Apply pea size amount with moisturizer and apply to face.   Discussed laser hair removal with ND Lag Laser as permanent option for removing hair Recommend 6 - 9 sessions  Maintenance  -  2 - 3 years may need touch up   The following medication counseling was provided: Spironolactone  Counseling: Patient advised regarding risks of diarrhea, abdominal pain, hyperkalemia, birth defects (for female patients), liver toxicity and renal toxicity. The patient may need blood work to monitor liver and kidney function and potassium levels while on therapy. The patient verbalized understanding of the proper use and possible adverse effects of spironolactone . All of the patient's questions and  concerns were addressed.   Hyperpigmentation Exam: brown macules of areas of prior razor bumps   Treatment Plan: - Continue Melaxemic Cream - apply pea sized amount topically to aa's qhs with Adapalene   - Continue SPF Sunscreen  - We will plan to follow up in 6 months to reassess   Return in about 6 months (around 01/07/2024) for Hirsutism and Hyperpigmentation F/U.   Documentation: I have reviewed the above documentation for accuracy and completeness, and I agree with the above.  I, Jetta Ager, am acting as scribe for Delon Lenis, DO.  Delon Lenis, DO

## 2023-07-10 NOTE — Patient Instructions (Addendum)
 Hello Brittany Mcintyre,  Thank you for visiting us  today.   Here is a summary of the key instructions from today's consultation:  Medications:   Adapalene : Continue using every other night.   Melasmic Lightening Cream: Use every night mixed with Adapalene , and alone in the morning.   Spironolactone : Maintain at 100 mg once daily. Please contact us  via MyChart for refills when needed.  Skin Care:   Moisturizer: Apply La Economist with UV daily. Samples have been provided for you to try.  Sun Protection:   Ensure to use sunscreen daily to maintain an even complexion.  Follow-Up:   We will see you again in August 2025 to assess progress and possibly adjust your treatment.  Thank you for your cooperation. We look forward to seeing the positive changes in your next visit. If you have any questions or concerns before then, please do not hesitate to contact our office.  Best regards,  Dr. [facility]    Important Information   Due to recent changes in healthcare laws, you may see results of your pathology and/or laboratory studies on MyChart before the doctors have had a chance to review them. We understand that in some cases there may be results that are confusing or concerning to you. Please understand that not all results are received at the same time and often the doctors may need to interpret multiple results in order to provide you with the best plan of care or course of treatment. Therefore, we ask that you please give us  2 business days to thoroughly review all your results before contacting the office for clarification. Should we see a critical lab result, you will be contacted sooner.     If You Need Anything After Your Visit   If you have any questions or concerns for your doctor, please call our main line at [phone]. If no one answers, please leave a voicemail as directed and we will return your call as soon as possible.  Messages left after 4 pm will be answered the following business day.    You may also send us  a message via MyChart. We typically respond to MyChart messages within 1-2 business days.  For prescription refills, please ask your pharmacy to contact our office. Our fax number is [phone].  If you have an urgent issue when the clinic is closed that cannot wait until the next business day, you can page your doctor at the number below.     Please note that while we do our best to be available for urgent issues outside of office hours, we are not available 24/7.    If you have an urgent issue and are unable to reach us , you may choose to seek medical care at your doctor's office, retail clinic, urgent care center, or emergency room.   If you have a medical emergency, please immediately call 911 or go to the emergency department. In the event of inclement weather, please call our main line at [phone] for an update on the status of any delays or closures.  Dermatology Medication Tips: Please keep the boxes that topical medications come in in order to help keep track of the instructions about where and how to use these. Pharmacies typically print the medication instructions only on the boxes and not directly on the medication tubes.   If your medication is too expensive, please contact our office at [phone] or send us  a message through MyChart.    We are unable  to tell what your co-pay for medications will be in advance as this is different depending on your insurance coverage. However, we may be able to find a substitute medication at lower cost or fill out paperwork to get insurance to cover a needed medication.    If a prior authorization is required to get your medication covered by your insurance company, please allow us  1-2 business days to complete this process.   Drug prices often vary depending on where the prescription is filled and some pharmacies may offer cheaper prices.    The website www.goodrx.com contains coupons for medications through different pharmacies. The prices here do not account for what the cost may be with help from insurance (it may be cheaper with your insurance), but the website can give you the price if you did not use any insurance.  - You can print the associated coupon and take it with your prescription to the pharmacy.  - You may also stop by our office during regular business hours and pick up a GoodRx coupon card.  - If you need your prescription sent electronically to a different pharmacy, notify our office through [facility] or by phone at [phone]

## 2023-07-30 ENCOUNTER — Encounter: Payer: Self-pay | Admitting: Dermatology

## 2023-07-31 ENCOUNTER — Ambulatory Visit: Payer: BC Managed Care – PPO | Admitting: Dermatology

## 2023-07-31 ENCOUNTER — Encounter: Payer: Self-pay | Admitting: Dermatology

## 2023-07-31 VITALS — BP 108/63

## 2023-07-31 DIAGNOSIS — L7211 Pilar cyst: Secondary | ICD-10-CM

## 2023-07-31 DIAGNOSIS — D492 Neoplasm of unspecified behavior of bone, soft tissue, and skin: Secondary | ICD-10-CM

## 2023-07-31 DIAGNOSIS — D485 Neoplasm of uncertain behavior of skin: Secondary | ICD-10-CM

## 2023-07-31 NOTE — Patient Instructions (Signed)

## 2023-07-31 NOTE — Progress Notes (Signed)
 Follow-Up Visit   Subjective  Brittany Mcintyre is a 38 y.o. female who presents for the following: Excision of a neoplasm of skin on the vertex of scalp, referred by Dr. Onalee Hua. Lesion has been present for years, has never drained or been previously treated.  The following portions of the chart were reviewed this encounter and updated as appropriate: medications, allergies, medical history  Review of Systems:  No other skin or systemic complaints except as noted in HPI or Assessment and Plan.  Objective  Well appearing patient in no apparent distress; mood and affect are within normal limits.  A focused examination was performed of the following areas: Vertex of scalp Relevant physical exam findings are noted in the Assessment and Plan.   vertex of scalp  3cm mobile non tender subcutaneous nodule   Assessment & Plan   NEOPLASM OF UNCERTAIN BEHAVIOR OF SKIN vertex of scalp Skin excision  Excision method:  elliptical Lesion length (cm):  2 Lesion width (cm):  2.5 Margin per side (cm):  0.1 Total excision diameter (cm):  2.7 Informed consent: discussed and consent obtained   Timeout: patient name, date of birth, surgical site, and procedure verified   Procedure prep:  Patient was prepped and draped in usual sterile fashion Prep type:  Chlorhexidine Anesthesia: the lesion was anesthetized in a standard fashion   Anesthetic:  1% lidocaine w/ epinephrine 1-100,000 buffered w/ 8.4% NaHCO3 Instrument used: #15 blade   Hemostasis achieved with: suture, pressure and electrodesiccation   Outcome: patient tolerated procedure well with no complications   Post-procedure details: sterile dressing applied and wound care instructions given   Dressing type: pressure dressing and bandage    Skin repair Complexity:  Complex Final length (cm):  4.2 Informed consent: discussed and consent obtained   Timeout: patient name, date of birth, surgical site, and procedure verified   Procedure  prep:  Patient was prepped and draped in usual sterile fashion Prep type:  Chlorhexidine Anesthesia: the lesion was anesthetized in a standard fashion   Anesthetic:  1% lidocaine w/ epinephrine 1-100,000 buffered w/ 8.4% NaHCO3 Reason for type of repair: reduce tension to allow closure, reduce the risk of dehiscence, infection, and necrosis, reduce subcutaneous dead space and avoid a hematoma, allow closure of the large defect, preserve normal anatomy, preserve normal anatomical and functional relationships and enhance both functionality and cosmetic results   Undermining: area extensively undermined   Undermining comment:  Undermining defect  Subcutaneous layers (deep stitches):  Suture size:  3-0 Suture type: Vicryl (polyglactin 910)   Stitches:  Buried vertical mattress (inverted dermal) Fine/surface layer approximation (top stitches):  Suture type comment:  Staples Stitches comment:  Staples Suture removal (days):  7 Hemostasis achieved with: suture and pressure Outcome: patient tolerated procedure well with no complications   Post-procedure details: sterile dressing applied and wound care instructions given   Dressing type: bandage and pressure dressing (mupirocin)    The surgical wound was then cleaned, prepped, and re-anesthetized as above. Wound edges were undermined extensively along at least one entire edge and at a distance equal to or greater than the width of the defect (see wound defect size above) in order to achieve closure and decrease wound tension and anatomic distortion. Redundant tissue repair including standing cone removal was performed. Hemostasis was achieved with electrocautery. Subcutaneous and epidermal tissues were approximated with the above sutures. The surgical site was then lightly scrubbed with sterile, saline-soaked gauze. The area was then bandaged using Vaseline ointment, non-adherent gauze,  gauze pads, and tape to provide an adequate pressure dressing. The  patient tolerated the procedure well, was given detailed written and verbal wound care instructions, and was discharged in good condition.   The patient will follow-up: 10-14 days.   Return for 10-14 days post op.  I, Joanie Coddington, CMA, am acting as scribe for Gwenith Daily, MD .   Documentation: I have reviewed the above documentation for accuracy and completeness, and I agree with the above.  Gwenith Daily, MD

## 2023-08-01 LAB — SURGICAL PATHOLOGY

## 2023-08-05 DIAGNOSIS — F331 Major depressive disorder, recurrent, moderate: Secondary | ICD-10-CM | POA: Diagnosis not present

## 2023-08-05 DIAGNOSIS — Z634 Disappearance and death of family member: Secondary | ICD-10-CM | POA: Diagnosis not present

## 2023-08-14 ENCOUNTER — Ambulatory Visit: Payer: BC Managed Care – PPO | Admitting: Dermatology

## 2023-08-14 ENCOUNTER — Encounter: Payer: Self-pay | Admitting: Dermatology

## 2023-08-14 DIAGNOSIS — L905 Scar conditions and fibrosis of skin: Secondary | ICD-10-CM

## 2023-08-14 NOTE — Progress Notes (Signed)
   Follow-Up Visit   Subjective  Brittany Mcintyre is a 38 y.o. female who presents for the following: follow up after excision on scalp. She states that it has been healing well.   The following portions of the chart were reviewed this encounter and updated as appropriate: medications, allergies, medical history  Review of Systems:  No other skin or systemic complaints except as noted in HPI or Assessment and Plan.  Objective  Well appearing patient in no apparent distress; mood and affect are within normal limits.  A focused examination was performed of the following areas:  scalp  Relevant exam findings are noted in the Assessment and Plan.    Assessment & Plan   Scar s/p WLE for pilar cyst on the scalp, treated on 07/31/2023, repaired with linear closure - Reassured that wound has healed well - Discussed that scars take up to 12 months to mature from the date of surgery - Recommend SPF 30+ to scar daily to prevent purple color - OK to start scar massage at 4-6 weeks post-op - Can consider silicone based products for scar healing - Staples removed today  Return if symptoms worsen or fail to improve.  I, Manual Meier, Surg Tech III, am acting as scribe for Gwenith Daily, MD.   Documentation: I have reviewed the above documentation for accuracy and completeness, and I agree with the above.  Gwenith Daily, MD

## 2023-08-19 DIAGNOSIS — F331 Major depressive disorder, recurrent, moderate: Secondary | ICD-10-CM | POA: Diagnosis not present

## 2023-08-19 DIAGNOSIS — Z634 Disappearance and death of family member: Secondary | ICD-10-CM | POA: Diagnosis not present

## 2023-08-22 ENCOUNTER — Ambulatory Visit: Payer: BC Managed Care – PPO | Admitting: Cardiovascular Disease

## 2023-08-22 DIAGNOSIS — Z0000 Encounter for general adult medical examination without abnormal findings: Secondary | ICD-10-CM | POA: Diagnosis not present

## 2023-08-22 DIAGNOSIS — Z1331 Encounter for screening for depression: Secondary | ICD-10-CM | POA: Diagnosis not present

## 2023-08-22 DIAGNOSIS — Z6841 Body Mass Index (BMI) 40.0 and over, adult: Secondary | ICD-10-CM | POA: Diagnosis not present

## 2023-08-22 DIAGNOSIS — R7303 Prediabetes: Secondary | ICD-10-CM | POA: Insufficient documentation

## 2023-08-22 DIAGNOSIS — E559 Vitamin D deficiency, unspecified: Secondary | ICD-10-CM | POA: Diagnosis not present

## 2023-08-22 DIAGNOSIS — E785 Hyperlipidemia, unspecified: Secondary | ICD-10-CM | POA: Diagnosis not present

## 2023-08-22 DIAGNOSIS — R635 Abnormal weight gain: Secondary | ICD-10-CM | POA: Diagnosis not present

## 2023-08-29 ENCOUNTER — Encounter: Payer: Self-pay | Admitting: Dermatology

## 2023-08-29 DIAGNOSIS — L680 Hirsutism: Secondary | ICD-10-CM

## 2023-08-29 DIAGNOSIS — Z0000 Encounter for general adult medical examination without abnormal findings: Secondary | ICD-10-CM | POA: Insufficient documentation

## 2023-08-29 DIAGNOSIS — R7301 Impaired fasting glucose: Secondary | ICD-10-CM | POA: Diagnosis not present

## 2023-08-29 DIAGNOSIS — E559 Vitamin D deficiency, unspecified: Secondary | ICD-10-CM | POA: Diagnosis not present

## 2023-08-29 MED ORDER — SPIRONOLACTONE 100 MG PO TABS
200.0000 mg | ORAL_TABLET | Freq: Every day | ORAL | 2 refills | Status: DC
Start: 2023-08-29 — End: 2023-09-24

## 2023-09-01 DIAGNOSIS — R7301 Impaired fasting glucose: Secondary | ICD-10-CM | POA: Insufficient documentation

## 2023-09-07 ENCOUNTER — Encounter: Payer: Self-pay | Admitting: Dermatology

## 2023-09-09 DIAGNOSIS — Z634 Disappearance and death of family member: Secondary | ICD-10-CM | POA: Diagnosis not present

## 2023-09-09 DIAGNOSIS — F331 Major depressive disorder, recurrent, moderate: Secondary | ICD-10-CM | POA: Diagnosis not present

## 2023-09-24 ENCOUNTER — Ambulatory Visit (INDEPENDENT_AMBULATORY_CARE_PROVIDER_SITE_OTHER): Payer: BC Managed Care – PPO | Admitting: Cardiovascular Disease

## 2023-09-24 ENCOUNTER — Encounter (HOSPITAL_BASED_OUTPATIENT_CLINIC_OR_DEPARTMENT_OTHER): Payer: Self-pay | Admitting: Cardiovascular Disease

## 2023-09-24 VITALS — BP 101/68 | HR 74 | Ht 67.0 in | Wt 324.1 lb

## 2023-09-24 DIAGNOSIS — E782 Mixed hyperlipidemia: Secondary | ICD-10-CM | POA: Diagnosis not present

## 2023-09-24 DIAGNOSIS — G4733 Obstructive sleep apnea (adult) (pediatric): Secondary | ICD-10-CM | POA: Diagnosis not present

## 2023-09-24 DIAGNOSIS — L680 Hirsutism: Secondary | ICD-10-CM

## 2023-09-24 DIAGNOSIS — I10 Essential (primary) hypertension: Secondary | ICD-10-CM | POA: Diagnosis not present

## 2023-09-24 MED ORDER — SPIRONOLACTONE 100 MG PO TABS: 100.0000 mg | ORAL_TABLET | Freq: Every day | ORAL | Status: DC

## 2023-09-24 NOTE — Progress Notes (Signed)
 Advanced Hypertension Clinic Follow-up:    Date:  09/24/2023   ID:  Brittany Mcintyre, DOB 11-Jun-1985, MRN [id number]  PCP:  Bari Boos, FNP  Cardiologist:  Maudine Sos, MD  Nephrologist:  Referring MD: [facility] *   CC: Hypertension  History of Present Illness:    Brittany Mcintyre is a 38 y.o. female with a hx of hypertension, and OSA, here for follow-up.  She noted that she was first diagnosed with hypertension in her teens.  She did not recall having any work-up for why she developed hypertension at such a young age.  She started taking medications in 2019.  She noted that she is interested in potentially having a baby sometime in the next several years but does not yet have a partner. At her initial visit, her blood pressure was 131/94. She was enrolled in the PREP program at the [facility]. She had renal artery dopplers 03/2021 that were normal. She was also referred for a sleep study. We discussed switching off of an ARB if she did decide to become pregnant. Given she did not have a partner at the time, she was switched from losartan to valsartan .   At her visit 12/2021 She reported blood pressures similar to 133/89 on average. Her amlodipine  was increased. She also complained of LE edema with no significant change in the interim. She was encouraged to keep exercising.   In 02/2022 she was doing well with weight loss. She noted improved exercise tolerance and was monitoring her sodium intake. She had not yet received her CPAP. Her blood pressure was nearly controlled both at home and in the office. We changed her regimen to a combination of amlodipine /valsartan /HCTZ. Per MyChart messages 07/15/2022 she had been off of her antihypertensive regimen for about 4 weeks and noted a BP reading of 148/95. She had been unable to pick it up due to high costs. Her amlodipine /valsartan /HCTZ was resumed at 10/160/25 mg daily.  At her visit 09/2022 she was doing well and BP was  well-controlled.   Discussed the use of AI scribe software for clinical note transcription with the patient, who gave verbal consent to proceed.  History of Present Illness Brittany Mcintyre is a 38 year old female with hypertension who presents for a routine follow-up.  Her blood pressure readings at home have been stable, with recent measurements around 109/66 mmHg. She experiences no lightheadedness or dizziness. Her current medications include amlodipine , valsartan , hydrochlorothiazide , and spironolactone . She takes 100 mg of spironolactone  instead of the prescribed 200 mg due to headaches at the higher dose.  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 She tries to walk her dog daily and occasionally walks when going to campus. She wants to be more intentional about incorporating exercise into her routine. No chest pain, pressure, or breathing difficulties during physical activity. She also denies any swelling in her legs or feet.  Her last A1c was 5.8%, indicating a pre-diabetic range.   Previous antihypertensives: Losartan/HCTZ   Past Medical History:  Diagnosis Date   Asthma    Essential hypertension 02/08/2021   Morbid obesity (HCC) 02/08/2021   OSA (obstructive sleep apnea) 06/23/2021   PCOS (polycystic ovarian syndrome)    Pure hypercholesterolemia 09/13/2022    History reviewed. No pertinent surgical history.  Current Medications: Current Meds  Medication Sig   Adapalene  (DIFFERIN ) 0.3 % gel Apply 1 Application topically at bedtime. Apply pea size amount mixed with Cereve every other night.  Alternate with Hydroquinone    amLODIPine -Valsartan -HCTZ 5-160-12.5 MG TABS Take 1 tablet by mouth daily.   cetirizine (ZYRTEC) 10 MG tablet Take 10 mg by mouth daily as needed for allergies.   hydroquinone  4 % cream Apply topically at bedtime. Apply pea size amount mixed with Cereve every other night. Alternate with Adapalene     Multiple Vitamins-Minerals (MULTIVITAMIN ADULT PO) Take 1 tablet by mouth daily.   rosuvastatin (CRESTOR) 5 MG tablet Take 5 mg by mouth daily.   Safety Seal Miscellaneous MISC Apply 1 Application topically at bedtime. Medication name : Melaxemic Cream   [DISCONTINUED] spironolactone  (ALDACTONE ) 100 MG tablet Take 2 tablets (200 mg total) by mouth at bedtime.     Allergies:   Shellfish allergy   Social History   Socioeconomic History   Marital status: Single    Spouse name: Not on file   Number of children: Not on file   Years of education: Not on file   Highest education level: Not on file  Occupational History   Not on file  Tobacco Use   Smoking status: Never    Passive exposure: Never   Smokeless tobacco: Never  Substance and Sexual Activity   Alcohol use: No   Drug use: Not on file   Sexual activity: Not on file  Other Topics Concern   Not on file  Social History Narrative   Not on file   Social Drivers of Health   Financial Resource Strain: Low Risk  (02/08/2021)   Overall Financial Resource Strain (CARDIA)    Difficulty of Paying Living Expenses: Not hard at all  Food Insecurity: No Food Insecurity (02/08/2021)   Hunger Vital Sign    Worried About Running Out of Food in the Last Year: Never true    Ran Out of Food in the Last Year: Never true  Transportation Needs: No Transportation Needs (02/08/2021)   PRAPARE - Administrator, Civil Service (Medical): No    Lack of Transportation (Non-Medical): No  Physical Activity: Inactive (02/08/2021)   Exercise Vital Sign    Days of Exercise per Week: 0 days    Minutes of Exercise per Session: 0 min  Stress: Not on file  Social Connections: Unknown (10/07/2021)   Received from [facility], [facility]   Social Network    Social Network: Not on file     Family History: The patient's family history includes Diabetes in her mother; Heart attack in her maternal grandmother and mother; Hypertension in her  father, maternal grandfather, maternal grandmother, mother, and paternal grandmother; Stroke in her paternal grandmother.  ROS:   Please see the history of present illness.    (+) LUE myalgias All other systems reviewed and are negative.  EKGs/Labs/Other Studies Reviewed:    Renal Artery Doppler 03/15/21 Largest Aortic Diameter: 2.4 cm  Renal:     Right: Normal size right kidney. Normal right Resisitive Index.         Normal cortical thickness of right kidney. No evidence of         right renal artery stenosis. RRV flow present.  Left:  Normal size of left kidney. Normal left Resistive Index.         Normal cortical thickness of the left kidney. No evidence of         left renal artery stenosis. LRV flow present.  Mesenteric:  Normal Celiac artery and Superior Mesenteric artery findings.   EKG: EKG was personally reviewed. 09/13/2022:  EKG was not ordered. 02/20/2022:  EKG was not ordered. 01/02/2022:  Sinus rhythm. Rate 85 bpm. Left axis deviation. Low voltage. 02/08/21: EKG is sinus rhythm.  Rate 91 bpm.  Low voltage.    Recent Labs: 04/27/2023: BUN 19; Creatinine, Ser 1.05; Hemoglobin 13.1; Platelets 368; Potassium 3.9; Sodium 137   Recent Lipid Panel No results found for: "CHOL", "TRIG", "HDL", "CHOLHDL", "VLDL", "LDLCALC", "LDLDIRECT"  Physical Exam:    VS:  BP 101/68   Pulse 74   Ht 5\' 7"  (1.702 m)   Wt (!) 324 lb 1.6 oz (147 kg)   SpO2 97%   BMI 50.76 kg/m  , BMI Body mass index is 50.76 kg/m. GENERAL:  Well appearing HEENT: Pupils equal round and reactive, fundi not visualized, oral mucosa unremarkable NECK:  No jugular venous distention, waveform within normal limits, carotid upstroke brisk and symmetric, no bruits LUNGS:  Clear to auscultation bilaterally HEART:  RRR.  PMI not displaced or sustained,S1 and S2 within normal limits, no S3, no S4, no clicks, no rubs, no murmurs ABD:  Flat, positive bowel sounds normal in frequency in pitch, no bruits, no rebound, no  guarding, no midline pulsatile mass, no hepatomegaly, no splenomegaly EXT:  2 plus pulses throughout, no edema, no cyanosis no clubbing SKIN:  No rashes no nodules NEURO:  Cranial nerves II through XII grossly intact, motor grossly intact throughout PSYCH:  Cognitively intact, oriented to person place and time   ASSESSMENT/PLAN:    Assessment & Plan # Hypertension Hypertension well-controlled with current regimen. Spironolactone  dose reduced to 100 mg due to headaches. - Continue amlodipine , valsartan , hydrochlorothiazide , spironolactone  100 mg. - Report pregnancy immediately for medication adjustment.  # Pre-diabetes Pre-diabetes with A1c of 5.8%. Emphasized lifestyle modifications for glucose management. - Schedule regular exercise, e.g., 15-minute daily walk. - Increase whole grains, reduce processed carbohydrates.  # Hyperlipidemia:  Continue rosuvastatin.    Screening for Secondary Hypertension:     02/08/2021    4:03 PM  Causes  Drugs/Herbals Screened     - Comments limited caffeine.  Renovascular HTN Screened     - Comments check renal Dopplers  Sleep Apnea Screened     - Comments will get sleep study  Thyroid  Disease Screened  Hyperaldosteronism N/A  Pheochromocytoma N/A  Cushing's Syndrome N/A  Hyperparathyroidism N/A  Coarctation of the Aorta Screened     - Comments BP symmetric  Compliance Screened     - Comments struggles but she is improving.    Relevant Labs/Studies:    Latest Ref Rng & Units 04/27/2023    3:11 PM 03/14/2021   11:17 AM  Basic Labs  Sodium 135 - 145 mmol/L 137  138   Potassium 3.5 - 5.1 mmol/L 3.9  3.6   Creatinine 0.44 - 1.00 mg/dL 0.98  1.19                    03/15/2021   10:09 AM  Renovascular   Renal Artery US  Completed Yes     Disposition:  FU with [employer] Southers C. Theodis Fiscal, MD, [facility] in 1 year.  Medication Adjustments/Labs and Tests Ordered: Current medicines are reviewed at length with the patient today.  Concerns  regarding medicines are outlined above.   Orders Placed This Encounter  Procedures   EKG 12-Lead   Meds ordered this encounter  Medications   spironolactone  (ALDACTONE ) 100 MG tablet    Sig: Take 1 tablet (100 mg total) by mouth at bedtime.     Signed, Maudine Sos, MD  09/24/2023 6:12  PM    [facility]

## 2023-09-24 NOTE — Patient Instructions (Signed)
 Medication Instructions:  Your physician recommends that you continue on your current medications as directed. Please refer to the Current Medication list given to you today.   Follow-Up: Follow up in 12 months with Dr. Theodis Fiscal or Neomi Banks, NP in [facility]

## 2023-09-25 ENCOUNTER — Ambulatory Visit: Attending: Cardiovascular Disease | Admitting: Cardiovascular Disease

## 2023-09-25 ENCOUNTER — Encounter: Payer: Self-pay | Admitting: Cardiovascular Disease

## 2023-09-25 DIAGNOSIS — I10 Essential (primary) hypertension: Secondary | ICD-10-CM | POA: Diagnosis not present

## 2023-09-25 DIAGNOSIS — G4733 Obstructive sleep apnea (adult) (pediatric): Secondary | ICD-10-CM

## 2023-09-25 DIAGNOSIS — G4736 Sleep related hypoventilation in conditions classified elsewhere: Secondary | ICD-10-CM

## 2023-09-25 DIAGNOSIS — R0683 Snoring: Secondary | ICD-10-CM

## 2023-09-25 DIAGNOSIS — L680 Hirsutism: Secondary | ICD-10-CM

## 2023-09-25 DIAGNOSIS — E7800 Pure hypercholesterolemia, unspecified: Secondary | ICD-10-CM

## 2023-09-25 NOTE — Patient Instructions (Signed)
 Medication Instructions:  NO CHANGES *If you need a refill on your cardiac medications before your next appointment, please call your pharmacy*  Lab Work: NO LABS If you have labs (blood work) drawn today and your tests are completely normal, you will receive your results only by: MyChart Message (if you have MyChart) OR A paper copy in the mail If you have any lab test that is abnormal or we need to change your treatment, we will call you to review the results.  Testing/Procedures: NO TESTING  Follow-Up: At [facility], you and your health needs are our priority.  As part of our continuing mission to provide you with exceptional heart care, our providers are all part of one team.  This team includes your primary Cardiologist (physician) and Advanced Practice Providers or APPs (Physician Assistants and Nurse Practitioners) who all work together to provide you with the care you need, when you need it.  Your next appointment:   1 year(s)  Provider:   Maudine Sos, MD and As needed with Gaylyn Keas, MD  Other Instructions   1st Floor: - Lobby - Registration  - Pharmacy  - Lab - Cafe  2nd Floor: - PV Lab - Diagnostic Testing (echo, CT, nuclear med)  3rd Floor: - Vacant  4th Floor: - TCTS (cardiothoracic surgery) - AFib Clinic - Structural Heart Clinic - Vascular Surgery  - Vascular Ultrasound  5th Floor: - HeartCare Cardiology (general and EP) - Clinical Pharmacy for coumadin, hypertension, lipid, weight-loss medications, and med management appointments    Valet parking services will be available as well.

## 2023-09-25 NOTE — Progress Notes (Signed)
 Cardiology Office Note    Date:  09/25/2023   ID:  Brittany Mcintyre, DOB [birth year], MRN [id number]  PCP:  Bari Boos, FNP  Cardiologist:  Magnus Schuller, MD(sleep); Dr. Maudine Sos  1 year follow-up sleep evaluation initially referred by Dr. Maudine Sos for sleep evaluation following initiation of CPAP therapy.  History of Present Illness:  Brittany Mcintyre is a 38 y.o. female who is [facility] at [facility].  She sees Dr. Maudine Sos for hypertension.  She has a longstanding history of hypertension initially diagnosed when she was in her 64s.  She has been on antihypertensive medication since 2019.  Renal Doppler studies in October 2022 were normal.  Due to concerns for obstructive sleep apnea, she was referred for a diagnostic polysomnogram on May 22, 2021.  She was found to have moderate overall sleep apnea with an AHI of 27.9/h.  She had severe sleep apnea during REM sleep with an AHI of 81.6/h.  AHI while supine was 25.2/h.  There was significant oxygen desaturation to a nadir of 80% and she had loud snoring throughout her study.  I saw her for my initial sleep consultation on August 28, 2022.  Apparently she was unable to have an in lab therapeutic CPAP titration, and ultimately initiated AutoPap therapy after receiving a new ResMed AirSense 10 AutoSet unit on May 04, 2022 with [employer] as her DME company.  A download was obtained from November 30 through June 02, 2022.  She just barely met compliance standards with usage days at 87% and days with usage greater than 4 hours at 70%.  Average use, however was suboptimal at only 4 hours and 39 minutes of CPAP use per night.  At a pressure range of 7 to 20 cm of water, AHI was 2.3/h.  Her 95th percentile pressure was 11.9 and maximum average pressure 13.3 cm.  A subsequent download from February 22 through August 25, 2022 showed improved CPAP compliance now with average use of 7 hours and 24  minutes.  AHI was 3.6 and her 95th percentile pressure was 13.9 with maximum average pressure at 15.5.  She believes she is sleeping better since initiating CPAP therapy.  Often goes to bed late between 11 PM and midnight and wakes up around 7-7 30.  Her sleep is more restful.  Prior to initiating CPAP therapy her sleep is nonrestorative.  She had significant daytime sleepiness, she had loud snoring, and she had frequent nocturia of 3-4 times per night which has improved to 0-1 time per night.  She continues to be on amlodipine /valsartan /HCTZ 10/160/25 mg daily for blood pressure control.  During her initial patient she was doing remarkably well.  I had extensive discussion with her regarding untreated sleep apnea and potential adverse cardiovascular consequences which are well outlined in my prior discussion.  During that evaluation I made mild change to her CPAP and increased her ramp start pressure to 6 and changed her pressure to a range of 10 to 20 cm of water.  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  She continues to see Dr. Maudine Sos in just saw her yesterday.  She has been placed on high-dose spironolactone  by her dermatologist to read due to his facial hair growth.  She continues to be on amlodipine /valsartan /HCT 5/160/12.5 mg daily for blood pressure control.  She is on rosuvastatin 5 mg for hyper lipidemia.  She has not been routinely exercising but plans  to initiate walking at least 15 minutes a day if at all possible after discussion with Dr. Maudine Sos yesterday.  She presents for a 68-month follow-up sleep evaluation.  Past Medical History:  Diagnosis Date   Asthma    Essential hypertension 02/08/2021   Morbid obesity (HCC) 02/08/2021   OSA (obstructive sleep apnea) 06/23/2021   PCOS (polycystic ovarian syndrome)    Pure hypercholesterolemia 09/13/2022    No past surgical history on file.  Current Medications: Outpatient Medications  Prior to Visit  Medication Sig Dispense Refill   Adapalene  (DIFFERIN ) 0.3 % gel Apply 1 Application topically at bedtime. Apply pea size amount mixed with Cereve every other night. Alternate with Hydroquinone  45 g 2   amLODIPine -Valsartan -HCTZ 5-160-12.5 MG TABS Take 1 tablet by mouth daily. 90 tablet 3   cetirizine (ZYRTEC) 10 MG tablet Take 10 mg by mouth daily as needed for allergies.     hydroquinone  4 % cream Apply topically at bedtime. Apply pea size amount mixed with Cereve every other night. Alternate with Adapalene  28.35 g 2   Multiple Vitamins-Minerals (MULTIVITAMIN ADULT PO) Take 1 tablet by mouth daily.     rosuvastatin (CRESTOR) 5 MG tablet Take 5 mg by mouth daily.     Safety Seal Miscellaneous MISC Apply 1 Application topically at bedtime. Medication name : Melaxemic Cream 15 g 2   spironolactone  (ALDACTONE ) 100 MG tablet Take 1 tablet (100 mg total) by mouth at bedtime.     No facility-administered medications prior to visit.     Allergies:   Shellfish allergy   Social History   Socioeconomic History   Marital status: Single    Spouse name: Not on file   Number of children: Not on file   Years of education: Not on file   Highest education level: Not on file  Occupational History   Not on file  Tobacco Use   Smoking status: Never    Passive exposure: Never   Smokeless tobacco: Never  Substance and Sexual Activity   Alcohol use: No   Drug use: Not on file   Sexual activity: Not on file  Other Topics Concern   Not on file  Social History Narrative   Not on file   Social Drivers of Health   Financial Resource Strain: Low Risk  (02/08/2021)   Overall Financial Resource Strain (CARDIA)    Difficulty of Paying Living Expenses: Not hard at all  Food Insecurity: No Food Insecurity (02/08/2021)   Hunger Vital Sign    Worried About Running Out of Food in the Last Year: Never true    Ran Out of Food in the Last Year: Never true  Transportation Needs: No Transportation  Needs (02/08/2021)   PRAPARE - Administrator, Civil Service (Medical): No    Lack of Transportation (Non-Medical): No  Physical Activity: Inactive (02/08/2021)   Exercise Vital Sign    Days of Exercise per Week: 0 days    Minutes of Exercise per Session: 0 min  Stress: Not on file  Social Connections: Unknown (10/07/2021)   [facility]   Social Network    Social Network: Not on file    Socially, she was born in [place] .  She is single and has no children.  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  Family History:  The patient's family history includes Diabetes in her mother; Heart attack in her maternal grandmother and mother; Hypertension in her father,  maternal grandfather, maternal grandmother, mother, and paternal grandmother; Stroke in her paternal grandmother.   Her father is living, age 53 with hypertension.  Mother died at 62 and had myocardial infarction, hypertension and diabetes.  She has 1 brother.  ROS General: Negative; No fevers, chills, or night sweats; morbid obesity HEENT: Negative; No changes in vision or hearing, sinus congestion, difficulty swallowing Pulmonary: Negative; No cough, wheezing, shortness of breath, hemoptysis Cardiovascular: History of hypertension since her teens GI: Negative; No nausea, vomiting, diarrhea, or abdominal pain GU: Negative; No dysuria, hematuria, or difficulty voiding Musculoskeletal: Negative; no myalgias, joint pain, or weakness Hematologic/Oncology: Negative; no easy bruising, bleeding Endocrine: Negative; no heat/cold intolerance; no diabetes Neuro: Negative; no changes in balance, headaches Skin: Negative; No rashes or skin lesions Psychiatric: Negative; No behavioral problems, depression Sleep: OSA on CPAP therapy with no awareness of breakthrough snoring.  An Epworth Sleepiness Scale score was calculated today and this was excellent at 3 arguing against residual  daytime sleepiness on treatment. Other comprehensive 14 point system review is negative.   PHYSICAL EXAM:   VS:  BP 108/68   Pulse 71   Ht 5\' 7"  (1.702 m)   Wt (!) 325 lb 6.4 oz (147.6 kg)   SpO2 98%   BMI 50.96 kg/m     Repeat blood pressure by me was 100/60  Wt Readings from Last 3 Encounters:  09/25/23 (!) 325 lb 6.4 oz (147.6 kg)  09/24/23 (!) 324 lb 1.6 oz (147 kg)  04/27/23 (!) 323 lb (146.5 kg)    General: Alert, oriented, no distress.  Morbid obesity Skin: normal turgor, no rashes, warm and dry HEENT: Normocephalic, atraumatic. Pupils equal round and reactive to light; sclera anicteric; extraocular muscles intact; Nose without nasal septal hypertrophy Mouth/Parynx benign; Mallinpatti scale 4 Neck: Thick neck; no JVD, no carotid bruits; normal carotid upstroke Lungs: clear to ausculatation and percussion; no wheezing or rales Chest wall: without tenderness to palpitation Heart: PMI not displaced, RRR, s1 s2 normal, 1/6 systolic murmur, no diastolic murmur, no rubs, gallops, thrills, or heaves Abdomen: soft, nontender; no hepatosplenomehaly, BS+; abdominal aorta nontender and not dilated by palpation. Back: no CVA tenderness Pulses 2+ Musculoskeletal: full range of motion, normal strength, no joint deformities Extremities: no clubbing cyanosis or edema, Homan's sign negative  Neurologic: grossly nonfocal; Cranial nerves grossly wnl Psychologic: Normal mood and affect  Studies/Labs Reviewed:   EKG Interpretation Date/Time:  Tuesday September 25 2023 09:07:27 EDT Ventricular Rate:  71 PR Interval:  214 QRS Duration:  74 QT Interval:  402 QTC Calculation: 436 R Axis:   -20  Text Interpretation: Sinus rhythm with 1st degree A-V block Low voltage QRS Cannot rule out Anterior infarct , age undetermined When compared with ECG of 24-Sep-2023 16:12, PR interval has increased Confirmed by Magnus Schuller ([id number]) on 09/25/2023 9:16:10 AM    August 27, 2022 ECG (independently read  by me): NSR at 81, low voltage, no significant ST changes  Recent Labs:    Latest Ref Rng & Units 04/27/2023    3:11 PM 03/14/2021   11:17 AM  BMP  Glucose 70 - 99 mg/dL 604  86   BUN 6 - 20 mg/dL 19  9   Creatinine 5.40 - 1.00 mg/dL 9.81  1.91   BUN/Creat Ratio 9 - 23  11   Sodium 135 - 145 mmol/L 137  138   Potassium 3.5 - 5.1 mmol/L 3.9  3.6   Chloride 98 - 111 mmol/L 101  102  CO2 22 - 32 mmol/L 25  24   Calcium 8.9 - 10.3 mg/dL 9.6  9.4          No data to display             Latest Ref Rng & Units 04/27/2023    3:11 PM  CBC  WBC 4.0 - 10.5 K/uL 7.6   Hemoglobin 12.0 - 15.0 g/dL 40.9   Hematocrit 81.1 - 46.0 % 39.5   Platelets 150 - 400 K/uL 368    Lab Results  Component Value Date   MCV 87.8 04/27/2023   No results found for: "TSH" Lab Results  Component Value Date   HGBA1C 5.8 (H) 01/02/2022     BNP No results found for: "BNP"  ProBNP No results found for: "PROBNP"   Lipid Panel  No results found for: "CHOL", "TRIG", "HDL", "CHOLHDL", "VLDL", "LDLCALC", "LDLDIRECT", "LABVLDL"   RADIOLOGY: No results found.   Additional studies/ records that were reviewed today include:    Patient Name: Dannisha, Eckmann Date: 05/22/2021 Gender: Female D.O.B: 1986/01/10 Age (years): 35 Referring Provider: Maudine Sos Height (inches): 67 Interpreting Physician: Magnus Schuller MD, ABSM Weight (lbs): 325 RPSGT: Godwin Lat BMI: 51 MRN: [id number] Neck Size: 17.00   CLINICAL INFORMATION Sleep Study Type: NPSG   Indication for sleep study: Hypertension, Obesity   Epworth Sleepiness Score: 5   SLEEP STUDY TECHNIQUE As per the AASM Manual for the Scoring of Sleep and Associated Events v2.3 (April 2016) with a hypopnea requiring 4% desaturations.   The channels recorded and monitored were frontal, central and occipital EEG, electrooculogram (EOG), submentalis EMG (chin), nasal and oral airflow, thoracic and abdominal wall motion,  anterior tibialis EMG, snore microphone, electrocardiogram, and pulse oximetry.   MEDICATIONS amLODipine  (NORVASC ) 5 MG tablet cetirizine (ZYRTEC) 10 MG tablet metFORMIN (GLUCOPHAGE-XR) 500 MG 24 hr tablet Multiple Vitamins-Minerals (MULTIVITAMIN ADULT PO) valsartan -hydrochlorothiazide  (DIOVAN -HCT) 320-25 MG tablet  Medications self-administered by patient taken the night of the study : N/A   SLEEP ARCHITECTURE The study was initiated at 11:02:27 PM and ended at 5:05:25 AM.   Sleep onset time was 95.3 minutes and the sleep efficiency was 66.9%%. The total sleep time was 243 minutes.   Stage REM latency was 108.0 minutes.   The patient spent 10.3%% of the night in stage N1 sleep, 69.8%% in stage N2 sleep, 0.0%% in stage N3 and 20% in REM.   Alpha intrusion was absent.   Supine sleep was 62.76%.   RESPIRATORY PARAMETERS The overall apnea/hypopnea index (AHI) was 27.9 per hour. The respiratory disturbance indec (RDI) was 29.4/h. There were 4 total apneas, including 4 obstructive, 0 central and 0 mixed apneas. There were 109 hypopneas and 6 RERAs.   The AHI during Stage REM sleep was 81.6 per hour.   AHI while supine was 25.2 per hour.   The mean oxygen saturation was 93.6%. The minimum SpO2 during sleep was 80.0%.   Loud snoring was noted during this study.   CARDIAC DATA The 2 lead EKG demonstrated sinus rhythm. The mean heart rate was 85.1 beats per minute. Other EKG findings include: None.   LEG MOVEMENT DATA The total PLMS were 0 with a resulting PLMS index of 0.0. Associated arousal with leg movement index was 0.0 .   IMPRESSIONS - Moderate obstructive sleep apnea occurred during this study (AHI 27.9/h; RDI 29.4/h); however, events were very severe duiring REM sleep (AHI 81.6/h). - Moderate oxygen desaturation to a nadir of 80.0%. - The patient  snored with loud snoring volume. - No cardiac abnormalities were noted during this study. - Clinically significant periodic  limb movements did not occur during sleep. No significant associated arousals.   DIAGNOSIS - Obstructive Sleep Apnea (G47.33) - Nocturnal Hypoxemia (G47.36)   RECOMMENDATIONS - In this patient with significant cardiovascular co-miorbidities recommend an in-lab therapeutic CPAP titration to determine optimal pressure required to alleviate sleep disordered breathing. If unable to have an in-lab study, initiate Auto-PAP with EPR of 3 at 7 - 20 cm of water. - Effort should be made to optimize nasal and oropharyngeal patency. - Avoid alcohol, sedatives and other CNS depressants that may worsen sleep apnea and disrupt normal sleep architecture. - Sleep hygiene should be reviewed to assess factors that may improve sleep quality. - Weight management (BMI 51) and regular exercise should be initiated or continued if appropriate.     ASSESSMENT:    1. OSA (obstructive sleep apnea)   2. Essential hypertension   3. Snoring   4. Hypoxemia associated with sleep   5. Hirsutism   6. Morbid obesity (HCC)   7. Pure hypercholesterolemia     PLAN:  Ms. Haizel Gatchell is a 38 year old African-American female who has a longstanding history of hypertension since her teens.  She has been on medical therapy since 2019.  She is currently working on a PhD degree.  She has required triple drug therapy for blood pressure control and at present is on combination therapy with amlodipine /valsartan /HCTZ with a 10/160/25 mg combination pill.  She had significant symptoms highly suggestive of obstructive sleep apnea with nonrestorative sleep, daytime sleepiness, snoring, frequent awakenings, nocturia 3-4 times per night in addition to her obesity and hypertension.  My initial office consultation, I thoroughly reviewed her diagnostic polysomnogram  on May 22, 2021 which revealed moderate overall sleep apnea with an AHI of 27.9/h; however she had very severe sleep apnea during REM sleep with a REM AHI at 81.6/h.  There  was loud snoring throughout the study and oxygen nadir was 80%.  I had a long discussion with her today to provide education and understanding of sleep apnea and its particular adverse consequences associated with her cardiovascular health.  I discussed normal sleep architecture which is parasympathetic nervous system driven and typically accounts for 75% of her night sleep.  REM sleep is sympathetic driven.  I discussed arousals creating increased sympathetic tone during her parasympathetic non-REM sleep which plays a role in contributing to her blood pressure not lowering adequately doing sleep and associated with its effects on blood pressure elevation.  In addition I discussed its potential increased risk for nocturnal arrhythmias as well as potential for atrial fibrillation.  In addition, I discussed its negative effects on insulin resistance contributing to increased blood sugar, increased inflammatory markers, as well as increased nocturnal GERD.  If she does have underlying atherosclerosis, I discussed potential nocturnal hypoxemia contributing to nocturnal ischemia both in the cardiac as well as cerebrovascular circulation.  In addition, I discussed the pathophysiology associated with increased urinary frequency with untreated sleep apnea.  At her initial visit, she had noticed dramatic improvement in prior symptomatology.  At that time, I adjusted her pressure to a range of 10 to 20 cm of water.  For the past year, she has continued to use CPAP with 100% compliance.  Her most recent download reveals average use at 8 hours per night.  Her 95th percentile pressure is 12.4 with maximum average pressure 13.5.  AHI is excellent at 1.8.  At times she does admit to some mask leak.  She has been using a ResMed F30i fullface mask.  I demonstrated potential reduction in the nasal leak diet manipulation of nasal component.  She is doing well.  I discussed importance of increased weight loss and exercise.  Her blood  pressure is on the low side and this may be contributed by her recent initiation of high-dose spironolactone  prescribed by her dermatologist.  Blood pressure remains low she may require some slight dose adjustment of her triple therapy with amlodipine /losartan/HCTZ.  She will have cardiology follow-up with Dr. Maudine Sos.  She is on rosuvastatin 5 she is doing well from a sleep perspective.  I discussed with her my plans for retirement in several months.  She can be seen on a as needed basis for sleep with Dr. Micael Adas in the future if problems arise.   Medication Adjustments/Labs and Tests Ordered: Current medicines are reviewed at length with the patient today.  Concerns regarding medicines are outlined above.  Medication changes, Labs and Tests ordered today are listed in the Patient Instructions below. Patient Instructions  Medication Instructions:  NO CHANGES *If you need a refill on your cardiac medications before your next appointment, please call your pharmacy*  Lab Work: NO LABS If you have labs (blood work) drawn today and your tests are completely normal, you will receive your results only by: MyChart Message (if you have MyChart) OR A paper copy in the mail If you have any lab test that is abnormal or we need to change your treatment, we will call you to review the results.  Testing/Procedures: NO TESTING  Follow-Up: At [facility], you and your health needs are our priority.  As part of our continuing mission to provide you with exceptional heart care, our providers are all part of one team.  This team includes your primary Cardiologist (physician) and Advanced Practice Providers or APPs (Physician Assistants and Nurse Practitioners) who all work together to provide you with the care you need, when you need it.  Your next appointment:   1 year(s)  Provider:   Maudine Sos, MD and As needed with Gaylyn Keas, MD  Other Instructions   1st Floor: -  Lobby - Registration  - Pharmacy  - Lab - Cafe  2nd Floor: - PV Lab - Diagnostic Testing (echo, CT, nuclear med)  3rd Floor: - Vacant  4th Floor: - TCTS (cardiothoracic surgery) - AFib Clinic - Structural Heart Clinic - Vascular Surgery  - Vascular Ultrasound  5th Floor: - HeartCare Cardiology (general and EP) - Clinical Pharmacy for coumadin, hypertension, lipid, weight-loss medications, and med management appointments    Valet parking services will be available as well.       Signed, Magnus Schuller, MD, [facility], ABSM Diplomate, [place] Board of Sleep Medicine    09/25/2023 9:44 AM    [facility] [address], [address], [place], [place]  [address] Phone: [phone]

## 2023-10-07 DIAGNOSIS — F331 Major depressive disorder, recurrent, moderate: Secondary | ICD-10-CM | POA: Diagnosis not present

## 2023-10-07 DIAGNOSIS — Z634 Disappearance and death of family member: Secondary | ICD-10-CM | POA: Diagnosis not present

## 2023-10-21 DIAGNOSIS — Z634 Disappearance and death of family member: Secondary | ICD-10-CM | POA: Diagnosis not present

## 2023-10-21 DIAGNOSIS — F331 Major depressive disorder, recurrent, moderate: Secondary | ICD-10-CM | POA: Diagnosis not present

## 2023-10-28 ENCOUNTER — Encounter (HOSPITAL_BASED_OUTPATIENT_CLINIC_OR_DEPARTMENT_OTHER): Payer: Self-pay | Admitting: Cardiovascular Disease

## 2023-10-28 DIAGNOSIS — L680 Hirsutism: Secondary | ICD-10-CM

## 2023-11-04 DIAGNOSIS — Z634 Disappearance and death of family member: Secondary | ICD-10-CM | POA: Diagnosis not present

## 2023-11-04 DIAGNOSIS — F331 Major depressive disorder, recurrent, moderate: Secondary | ICD-10-CM | POA: Diagnosis not present

## 2023-11-06 NOTE — Telephone Encounter (Signed)
 Printed for TR to review.

## 2023-11-08 ENCOUNTER — Other Ambulatory Visit (HOSPITAL_BASED_OUTPATIENT_CLINIC_OR_DEPARTMENT_OTHER): Payer: Self-pay | Admitting: Cardiovascular Disease

## 2023-11-08 DIAGNOSIS — Z01419 Encounter for gynecological examination (general) (routine) without abnormal findings: Secondary | ICD-10-CM | POA: Diagnosis not present

## 2023-11-13 MED ORDER — SPIRONOLACTONE 25 MG PO TABS
25.0000 mg | ORAL_TABLET | Freq: Every day | ORAL | Status: DC
Start: 2023-11-13 — End: 2023-11-13

## 2023-11-13 MED ORDER — SPIRONOLACTONE 25 MG PO TABS
25.0000 mg | ORAL_TABLET | Freq: Every day | ORAL | 3 refills | Status: AC
Start: 2023-11-13 — End: ?

## 2023-11-30 ENCOUNTER — Encounter (HOSPITAL_COMMUNITY): Payer: Self-pay

## 2023-11-30 ENCOUNTER — Other Ambulatory Visit: Payer: Self-pay

## 2023-11-30 ENCOUNTER — Emergency Department (HOSPITAL_COMMUNITY)
Admission: EM | Admit: 2023-11-30 | Discharge: 2023-11-30 | Disposition: A | Discharge status: Home / Self Care | Attending: Emergency Medicine | Admitting: Emergency Medicine

## 2023-11-30 DIAGNOSIS — M545 Low back pain, unspecified: Secondary | ICD-10-CM | POA: Diagnosis not present

## 2023-11-30 DIAGNOSIS — I10 Essential (primary) hypertension: Secondary | ICD-10-CM | POA: Insufficient documentation

## 2023-11-30 DIAGNOSIS — J45909 Unspecified asthma, uncomplicated: Secondary | ICD-10-CM | POA: Insufficient documentation

## 2023-11-30 DIAGNOSIS — M5459 Other low back pain: Secondary | ICD-10-CM | POA: Diagnosis not present

## 2023-11-30 DIAGNOSIS — Z79899 Other long term (current) drug therapy: Secondary | ICD-10-CM | POA: Insufficient documentation

## 2023-11-30 MED ORDER — KETOROLAC TROMETHAMINE 15 MG/ML IJ SOLN
15.0000 mg | Freq: Once | INTRAMUSCULAR | Status: AC
  Administered 2023-11-30: 15 mg via INTRAMUSCULAR
  Filled 2023-11-30: qty 1

## 2023-11-30 MED ORDER — DEXAMETHASONE SODIUM PHOSPHATE 10 MG/ML IJ SOLN
10.0000 mg | Freq: Once | INTRAMUSCULAR | Status: AC
  Administered 2023-11-30: 10 mg via INTRAMUSCULAR
  Filled 2023-11-30: qty 1

## 2023-11-30 MED ORDER — METHYLPREDNISOLONE 4 MG PO TBPK: ORAL_TABLET | ORAL | 0 refills | Status: AC

## 2023-11-30 NOTE — ED Provider Notes (Signed)
 [place] EMERGENCY DEPARTMENT AT [facility] Provider Note   CSN: [id number] Arrival date & time: 11/30/23  1141     Patient presents with: Back Pain   Brittany Mcintyre is a 38 y.o. female.   Patient with history of obesity, hypertension, hyperlipidemia presents today with complaints of back pain.  She states that same began Sunday evening but was mild in nature.  States that the pain has been persistent since then.  Denies any trauma, recent heavy lifting or overuse.  Pain is right-sided in nature.  No saddle anesthesia, loss of bowel or bladder function.  She is able to walk with some discomfort.  Denies any weakness or numbness/tingling in her extremities.  States that she had an episode of similar pain a few years ago, was seen here for this and given medicine and it got better almost immediately and she has not had any issues since then.  No urinary symptoms.  LMP was a few days ago.  The history is provided by the patient. No language interpreter was used.  Back Pain      Prior to Admission medications   Medication Sig Start Date End Date Taking? Authorizing Provider  Adapalene  (DIFFERIN ) 0.3 % gel Apply 1 Application topically at bedtime. Apply pea size amount mixed with Cereve every other night. Alternate with Hydroquinone  10/26/22   Alm Delon SAILOR, DO  amLODIPine -Valsartan -HCTZ 5-160-12.5 MG TABS TAKE 1 TABLET BY MOUTH DAILY 11/08/23   Raford Riggs, MD  cetirizine (ZYRTEC) 10 MG tablet Take 10 mg by mouth daily as needed for allergies.    [provider]  hydroquinone  4 % cream Apply topically at bedtime. Apply pea size amount mixed with Cereve every other night. Alternate with Adapalene  10/26/22   Alm Delon SAILOR, DO  Multiple Vitamins-Minerals (MULTIVITAMIN ADULT PO) Take 1 tablet by mouth daily.    [provider]  rosuvastatin (CRESTOR) 5 MG tablet Take 5 mg by mouth daily.    [provider]  Safety Seal Miscellaneous MISC  Apply 1 Application topically at bedtime. Medication name : Melaxemic Cream 03/05/23   Alm Delon SAILOR, DO  spironolactone  (ALDACTONE ) 25 MG tablet Take 1 tablet (25 mg total) by mouth at bedtime. 11/13/23   Raford Riggs, MD    Allergies: Shellfish allergy    Review of Systems  Musculoskeletal:  Positive for back pain.  All other systems reviewed and are negative.   Updated Vital Signs BP (!) 132/95 (BP Location: Left Arm)   Pulse 73   Temp 98.4 F (36.9 C) (Oral)   Resp 18   Ht 5' 7 (1.702 m)   Wt (!) 142 kg   SpO2 96%   BMI 49.02 kg/m   Physical Exam Vitals and nursing note reviewed.  Constitutional:      General: She is not in acute distress.    Appearance: Normal appearance. She is normal weight. She is not ill-appearing, toxic-appearing or diaphoretic.  HENT:     Head: Normocephalic and atraumatic.   Cardiovascular:     Rate and Rhythm: Normal rate.  Pulmonary:     Effort: Pulmonary effort is normal. No respiratory distress.  Abdominal:     General: Abdomen is flat.     Palpations: Abdomen is soft.     Tenderness: There is no abdominal tenderness. There is no right CVA tenderness.   Musculoskeletal:        General: Normal range of motion.     Cervical back: Normal range of motion.  Comments: No midline tenderness to palpation of the cervical, thoracic, or lumbar spine.  No step-offs, lesions, deformity, or overlying skin changes.  Mild tenderness noted to the right paraspinous muscles.  Negative SLR.  Good distal pulses and sensation.  5/5 strength bilaterally   Skin:    General: Skin is warm and dry.   Neurological:     General: No focal deficit present.     Mental Status: She is alert.   Psychiatric:        Mood and Affect: Mood normal.        Behavior: Behavior normal.     (all labs ordered are listed, but only abnormal results are displayed) Labs Reviewed - No data to display  EKG: None  Radiology: No results found.   Procedures    Medications Ordered in the ED  dexamethasone  (DECADRON ) injection 10 mg (has no administration in time range)  ketorolac  (TORADOL ) 15 MG/ML injection 15 mg (has no administration in time range)                                    Medical Decision Making Risk Prescription drug management.   This patient is a 37 y.o. female who presents to the ED for concern of back pain, this involves an extensive number of treatment options, and is a complaint that carries with it a high risk of complications and morbidity. The emergent differential diagnosis prior to evaluation includes, but is not limited to,  Fracture (acute/chronic), muscle strain, cauda equina, spinal stenosis, DDD, metastatic cancer, vertebral osteomyelitis, kidney stone, pyelonephritis, AAA  This is not an exhaustive differential.   Past Medical History / Co-morbidities / Social History:  has a past medical history of Asthma, Essential hypertension (02/08/2021), Morbid obesity (HCC) (02/08/2021), OSA (obstructive sleep apnea) (06/23/2021), PCOS (polycystic ovarian syndrome), and Pure hypercholesterolemia (09/13/2022).  Additional history: Chart reviewed. Pertinent results include: seen in 2023 for similar pain, given IM decadron  and medrol  dosepak and discharged home.   Physical Exam: Physical exam performed. The pertinent findings include: No midline tenderness to palpation of the cervical, thoracic, or lumbar spine.  No step-offs, lesions, deformity, or overlying skin changes.  Mild tenderness noted to the right paraspinous muscles.  Negative SLR. Good distal pulses and sensation.  5/5 strength bilaterally  Medications: I ordered medication including decadron , toradol   for pain. Reevaluation of the patient after these medicines showed that the patient improved. I have reviewed the patients home medicines and have made adjustments as needed.   Disposition: After consideration of the diagnostic results and the patients  response to treatment, I feel that emergency department workup does not suggest an emergent condition requiring admission or immediate intervention beyond what has been performed at this time. The plan is: discharge with medrol  dosepak, NSAIDs, close outpatient follow-up and return precautions. Patient without neurological deficits and normal neuro exam.  Patient can walk but states is painful.  No loss of bowel or bladder control.  No concern for cauda equina.  No fever, night sweats, weight loss, h/o cancer, IVDU.  Symptoms consistent with lumbar strain with radiculopathy. No indication for imaging at this time given no red flags or trauma. RICE protocol and pain medicine indicated and discussed with patient. Evaluation and diagnostic testing in the emergency department does not suggest an emergent condition requiring admission or immediate intervention beyond what has been performed at this time.  Plan for discharge with close  PCP follow-up.  Patient is understanding and amenable with plan, educated on red flag symptoms that would prompt immediate return.  Patient discharged in stable condition.  Final diagnoses:  Acute right-sided low back pain without sciatica    ED Discharge Orders          Ordered    methylPREDNISolone  (MEDROL  DOSEPAK) 4 MG TBPK tablet        11/30/23 1330          An After Visit Summary was printed and given to the patient.      Brittany Mcintyre 11/30/23 1332    Doretha Folks, MD 12/11/23 [phone]

## 2023-11-30 NOTE — ED Triage Notes (Signed)
 Patient presented to ER with lower back pain, starting 2 days ago.

## 2023-11-30 NOTE — Discharge Instructions (Signed)
 Your back pain is most likely due to a muscular strain.  There is been a lot of research on back pain, unfortunately the only thing that seems to really help is Tylenol  and ibuprofen.  Relative rest is also important to not lift greater than 10 pounds bending or twisting at the waist.  Please follow-up with your family physician.  The other thing that really seems to benefit patients is physical therapy which your doctor may send you for.  Please return to the emergency department for new numbness or weakness to your arms or legs. Difficulty with urinating or urinating or pooping on yourself.  Also if you cannot feel toilet paper when you wipe or get a fever.   Take 4 over the counter ibuprofen tablets 3 times a day or 2 over-the-counter naproxen  tablets twice a day for pain. Also take tylenol  1000mg (2 extra strength) four times a day.   Additionally, have given you a prescription for a steroid pack that you should take as prescribed in its entirety to help with inflammation in your back.  You can also try a heating pad, Salonpas patches that you can get over-the-counter, specifically those with methyl salicylate to help with inflammation.  Rest in a position of comfort and try gentle stretching exercises when your pain improves.  Return if development of any new or worsening symptoms

## 2023-12-23 DIAGNOSIS — F331 Major depressive disorder, recurrent, moderate: Secondary | ICD-10-CM | POA: Diagnosis not present

## 2023-12-23 DIAGNOSIS — Z634 Disappearance and death of family member: Secondary | ICD-10-CM | POA: Diagnosis not present

## 2024-01-06 DIAGNOSIS — F331 Major depressive disorder, recurrent, moderate: Secondary | ICD-10-CM | POA: Diagnosis not present

## 2024-01-06 DIAGNOSIS — Z634 Disappearance and death of family member: Secondary | ICD-10-CM | POA: Diagnosis not present

## 2024-01-08 ENCOUNTER — Ambulatory Visit: Payer: BC Managed Care – PPO | Admitting: Dermatology

## 2024-01-20 DIAGNOSIS — F331 Major depressive disorder, recurrent, moderate: Secondary | ICD-10-CM | POA: Diagnosis not present

## 2024-01-31 ENCOUNTER — Other Ambulatory Visit: Payer: Self-pay | Admitting: Obstetrics and Gynecology

## 2024-01-31 DIAGNOSIS — E041 Nontoxic single thyroid nodule: Secondary | ICD-10-CM

## 2024-02-06 ENCOUNTER — Ambulatory Visit
Admission: RE | Admit: 2024-02-06 | Discharge: 2024-02-06 | Disposition: A | Discharge status: Home / Self Care | Source: Ambulatory Visit | Attending: Obstetrics and Gynecology | Admitting: Obstetrics and Gynecology

## 2024-02-06 DIAGNOSIS — E049 Nontoxic goiter, unspecified: Secondary | ICD-10-CM | POA: Diagnosis not present

## 2024-02-06 DIAGNOSIS — E041 Nontoxic single thyroid nodule: Secondary | ICD-10-CM | POA: Diagnosis not present

## 2024-02-10 DIAGNOSIS — F331 Major depressive disorder, recurrent, moderate: Secondary | ICD-10-CM | POA: Diagnosis not present

## 2024-02-10 DIAGNOSIS — Z634 Disappearance and death of family member: Secondary | ICD-10-CM | POA: Diagnosis not present

## 2024-03-16 DIAGNOSIS — Z634 Disappearance and death of family member: Secondary | ICD-10-CM | POA: Diagnosis not present

## 2024-03-16 DIAGNOSIS — F331 Major depressive disorder, recurrent, moderate: Secondary | ICD-10-CM | POA: Diagnosis not present

## 2024-03-19 ENCOUNTER — Encounter: Payer: Self-pay | Admitting: Dermatology

## 2024-04-13 DIAGNOSIS — F331 Major depressive disorder, recurrent, moderate: Secondary | ICD-10-CM | POA: Diagnosis not present

## 2024-04-13 DIAGNOSIS — Z634 Disappearance and death of family member: Secondary | ICD-10-CM | POA: Diagnosis not present

## 2024-05-11 DIAGNOSIS — F331 Major depressive disorder, recurrent, moderate: Secondary | ICD-10-CM | POA: Diagnosis not present

## 2024-06-11 ENCOUNTER — Encounter: Payer: Self-pay | Admitting: Dermatology

## 2024-06-11 ENCOUNTER — Ambulatory Visit: Admitting: Dermatology

## 2024-06-11 VITALS — BP 96/65 | HR 76

## 2024-06-11 DIAGNOSIS — L810 Postinflammatory hyperpigmentation: Secondary | ICD-10-CM | POA: Diagnosis not present

## 2024-06-11 DIAGNOSIS — L680 Hirsutism: Secondary | ICD-10-CM

## 2024-06-11 DIAGNOSIS — L814 Other melanin hyperpigmentation: Secondary | ICD-10-CM

## 2024-06-11 MED ORDER — SAFETY SEAL MISCELLANEOUS MISC: 1.0000 | Freq: Every day | 9 refills | Status: AC

## 2024-06-11 MED ORDER — TRETINOIN 0.025 % EX CREA: TOPICAL_CREAM | Freq: Every day | CUTANEOUS | 5 refills | Status: AC

## 2024-06-11 NOTE — Progress Notes (Signed)
" ° °  Follow-Up Visit   Subjective  Brittany Mcintyre is a 39 y.o. female who presents for the following: Hirsutism with secondary PIH  Patient present today for follow up visit for Hirsutism with secondary PIH. Patient was last evaluated on 07/2023.   Hirsutism: At this visit for Hirsutism, patient was advised to continue Spironolactone  100 mg nightly along with Adapalene  0.3% cream every other night. Patient reports she was having some fatigue and dizziness in June. She was evaluate by Cards and was advised to lower the spironolactone  to from 100 mg to 25 mg to prevent sxs from worsening. Patient reports sxs are better and has not appeared again since switching. Patient reports medication changes (Spironolactone ).  PIH: At this visit for [facility], patient was instructed to continue Melaxemic Cream nightly in conjunction with Adapalene . She has run out of Melaxmic Cream but has not requested refill. She wanted to wait until her appointment today to see if her therapies will change.   Patient provided verbal consent for the use of an AI-assisted program to generate a detailed after-visit summary. The patient understands that the AI tool is used to support clinical documentation and that all information will be reviewed and verified by the healthcare provider.  Patient report she is not actively pregnant trying to conceive or nursing.   The following portions of the chart were reviewed this encounter and updated as appropriate: medications, allergies, medical history  Review of Systems:  No other skin or systemic complaints except as noted in HPI or Assessment and Plan.  Objective  Well appearing patient in no apparent distress; mood and affect are within normal limits.  A focused examination was performed of the following areas: Chin and Neck  Relevant exam findings are noted in the Assessment and Plan.         Assessment & Plan   Hirsutism and post-inflammatory hyperpigmentation Hirsutism  is managed with spironolactone  25 mg, effectively reducing symptoms without significant side effects. Hair growth has slightly increased but remains manageable. Post-inflammatory hyperpigmentation shows improvement with lightening of dark spots and background skin color. Hydroquinone  is avoided due to potential skin color washout and exacerbation of dark spots. New treatment with Eucerin Radiant Tone, containing thiaminol, is introduced for its non-irritating properties and similar mechanism to hydroquinone . Adapalene  is used for skin cell turnover, with a plan to transition to tretinoin  for enhanced efficacy.  - Continue spironolactone  25 mg daily. - Recommended laser hair removal with Dr. Mollie, requiring approximately eight sessions. - Use an electric facial trimmer for hair removal to reduce irritation and ingrown hairs. - Discontinued hydroquinone  due to potential adverse effects on skin color. - Introduced Eucerin Radiant Tone, to be used twice daily with melasma compound. - Prescribed tretinoin  0.025% for use two nights a week, increasing to three nights a week if tolerated. - Provided a prescription for melasma compound with refills. - Recommended Centella sunscreen as an alternative to [employer]. - Advised to use Hydrofera Blue High Glow for spot treating acne bumps or dark spots. OTHER MELANIN HYPERPIGMENTATION   Existing Treatments - Safety Seal Miscellaneous MISC - Apply 1 Application topically at bedtime. Medication name : Melaxemic Cream  Return in 9 months (on 03/11/2025) for Hirsutism & PIH F/U.  I, Jetta Ager, am acting as neurosurgeon for [employer], DO.  Documentation: I have reviewed the above documentation for accuracy and completeness, and I agree with the above.  Delon Lenis, DO   "

## 2024-06-11 NOTE — Patient Instructions (Addendum)
 VISIT SUMMARY:  Today, we discussed the management of your hair growth and skin discoloration. You are currently taking spironolactone , which has helped with your symptoms, and you have seen some improvement in your skin darkness. We reviewed your current treatments and made some adjustments to better address your concerns.  YOUR PLAN:  -HIRSUTISM:  Hirsutism is a condition of unwanted, female-pattern hair growth in women.   You will continue taking spironolactone  25 mg daily, which has been effective.   We recommend considering laser hair removal with Dr. Greig Blower at [facility], which typically requires about eight sessions.   Additionally, using an electric facial trimmer can help reduce irritation and ingrown hairs.  -POST-INFLAMMATORY HYPERPIGMENTATION:  Post-inflammatory hyperpigmentation is the darkening of the skin that can occur after an inflammatory wound.   We will introduce Eucerin Radiant Tone, to be used twice daily with your melasma compound.   You will also start using tretinoin  0.025% two nights a week, increasing to three nights if tolerated.   We provided a prescription for your melasma compound with refills and recommended Centella sunscreen as an alternative to [employer]. For spot treating acne bumps or dark spots, use Hydrofera Blue High Glow.  INSTRUCTIONS:  Please follow up with Dr. Blower for laser hair removal sessions. Continue using your current medications and follow the new treatment plan as discussed. If you experience any side effects or have concerns, schedule a follow-up appointment.         Important Information   Due to recent changes in healthcare laws, you may see results of your pathology and/or laboratory studies on MyChart before the doctors have had a chance to review them. We understand that in some cases there may be results that are confusing or concerning to you. Please understand that not all results are received at the same  time and often the doctors may need to interpret multiple results in order to provide you with the best plan of care or course of treatment. Therefore, we ask that you please give us  2 business days to thoroughly review all your results before contacting the office for clarification. Should we see a critical lab result, you will be contacted sooner.     If You Need Anything After Your Visit   If you have any questions or concerns for your doctor, please call our main line at [phone]. If no one answers, please leave a voicemail as directed and we will return your call as soon as possible. Messages left after 4 pm will be answered the following business day.    You may also send us  a message via MyChart. We typically respond to MyChart messages within 1-2 business days.  For prescription refills, please ask your pharmacy to contact our office. Our fax number is [phone].  If you have an urgent issue when the clinic is closed that cannot wait until the next business day, you can page your doctor at the number below.     Please note that while we do our best to be available for urgent issues outside of office hours, we are not available 24/7.    If you have an urgent issue and are unable to reach us , you may choose to seek medical care at your doctor's office, retail clinic, urgent care center, or emergency room.   If you have a medical emergency, please immediately call 911 or go to the emergency department. In the event of inclement weather, please call our main line at [phone]  for an update on the status of any delays or closures.  Dermatology Medication Tips: Please keep the boxes that topical medications come in in order to help keep track of the instructions about where and how to use these. Pharmacies typically print the medication instructions only on the boxes and not directly on the medication tubes.   If your medication is too expensive, please contact our office at  [phone] or send us  a message through MyChart.    We are unable to tell what your co-pay for medications will be in advance as this is different depending on your insurance coverage. However, we may be able to find a substitute medication at lower cost or fill out paperwork to get insurance to cover a needed medication.    If a prior authorization is required to get your medication covered by your insurance company, please allow us  1-2 business days to complete this process.   Drug prices often vary depending on where the prescription is filled and some pharmacies may offer cheaper prices.   The website www.goodrx.com contains coupons for medications through different pharmacies. The prices here do not account for what the cost may be with help from insurance (it may be cheaper with your insurance), but the website can give you the price if you did not use any insurance.  - You can print the associated coupon and take it with your prescription to the pharmacy.  - You may also stop by our office during regular business hours and pick up a GoodRx coupon card.  - If you need your prescription sent electronically to a different pharmacy, notify our office through [facility] or by phone at [phone]

## 2024-06-16 ENCOUNTER — Encounter: Payer: Self-pay | Admitting: Dermatology
